# Patient Record
Sex: Female | Born: 1935
Health system: Southern US, Community
[De-identification: ages and names within clinical notes are randomized; demographics above are authoritative.]

## PROBLEM LIST (undated history)

## (undated) DIAGNOSIS — E0590 Thyrotoxicosis, unspecified without thyrotoxic crisis or storm: Secondary | ICD-10-CM

## (undated) DIAGNOSIS — J449 Chronic obstructive pulmonary disease, unspecified: Secondary | ICD-10-CM

## (undated) DIAGNOSIS — F419 Anxiety disorder, unspecified: Secondary | ICD-10-CM

## (undated) DIAGNOSIS — C50919 Malignant neoplasm of unspecified site of unspecified female breast: Secondary | ICD-10-CM

## (undated) DIAGNOSIS — J3800 Paralysis of vocal cords and larynx, unspecified: Secondary | ICD-10-CM

## (undated) DIAGNOSIS — H3530 Unspecified macular degeneration: Secondary | ICD-10-CM

## (undated) DIAGNOSIS — E042 Nontoxic multinodular goiter: Secondary | ICD-10-CM

## (undated) DIAGNOSIS — Z9109 Other allergy status, other than to drugs and biological substances: Secondary | ICD-10-CM

## (undated) DIAGNOSIS — J189 Pneumonia, unspecified organism: Secondary | ICD-10-CM

## (undated) DIAGNOSIS — K219 Gastro-esophageal reflux disease without esophagitis: Secondary | ICD-10-CM

## (undated) HISTORY — PX: MASTECTOMY: SHX3

## (undated) HISTORY — PX: ADENOIDECTOMY: SUR15

## (undated) HISTORY — PX: TONSILLECTOMY: SUR1361

## (undated) HISTORY — PX: HIP FRACTURE SURGERY: SHX118

---

## 2011-03-03 DIAGNOSIS — H43819 Vitreous degeneration, unspecified eye: Secondary | ICD-10-CM | POA: Diagnosis not present

## 2011-03-03 DIAGNOSIS — H35059 Retinal neovascularization, unspecified, unspecified eye: Secondary | ICD-10-CM | POA: Diagnosis not present

## 2011-03-03 DIAGNOSIS — H353290 Exudative age-related macular degeneration, unspecified eye, stage unspecified: Secondary | ICD-10-CM | POA: Diagnosis not present

## 2011-03-03 DIAGNOSIS — H353190 Nonexudative age-related macular degeneration, unspecified eye, stage unspecified: Secondary | ICD-10-CM | POA: Diagnosis not present

## 2011-05-12 DIAGNOSIS — H43819 Vitreous degeneration, unspecified eye: Secondary | ICD-10-CM | POA: Diagnosis not present

## 2011-05-12 DIAGNOSIS — H353290 Exudative age-related macular degeneration, unspecified eye, stage unspecified: Secondary | ICD-10-CM | POA: Diagnosis not present

## 2011-05-12 DIAGNOSIS — H35059 Retinal neovascularization, unspecified, unspecified eye: Secondary | ICD-10-CM | POA: Diagnosis not present

## 2011-05-12 DIAGNOSIS — H353190 Nonexudative age-related macular degeneration, unspecified eye, stage unspecified: Secondary | ICD-10-CM | POA: Diagnosis not present

## 2011-05-13 DIAGNOSIS — Z79899 Other long term (current) drug therapy: Secondary | ICD-10-CM | POA: Diagnosis not present

## 2011-05-13 DIAGNOSIS — E789 Disorder of lipoprotein metabolism, unspecified: Secondary | ICD-10-CM | POA: Diagnosis not present

## 2011-05-13 DIAGNOSIS — I10 Essential (primary) hypertension: Secondary | ICD-10-CM | POA: Diagnosis not present

## 2011-06-02 DIAGNOSIS — H905 Unspecified sensorineural hearing loss: Secondary | ICD-10-CM | POA: Diagnosis not present

## 2011-06-02 DIAGNOSIS — H6120 Impacted cerumen, unspecified ear: Secondary | ICD-10-CM | POA: Diagnosis not present

## 2011-06-02 DIAGNOSIS — H9120 Sudden idiopathic hearing loss, unspecified ear: Secondary | ICD-10-CM | POA: Diagnosis not present

## 2011-06-18 ENCOUNTER — Emergency Department (HOSPITAL_BASED_OUTPATIENT_CLINIC_OR_DEPARTMENT_OTHER)
Admission: EM | Admit: 2011-06-18 | Discharge: 2011-06-18 | Disposition: A | Discharge status: Hospice | Payer: Medicare Other | Attending: Emergency Medicine | Admitting: Emergency Medicine

## 2011-06-18 ENCOUNTER — Emergency Department (HOSPITAL_BASED_OUTPATIENT_CLINIC_OR_DEPARTMENT_OTHER): Payer: Medicare Other

## 2011-06-18 ENCOUNTER — Encounter (HOSPITAL_BASED_OUTPATIENT_CLINIC_OR_DEPARTMENT_OTHER): Payer: Self-pay

## 2011-06-18 DIAGNOSIS — Z853 Personal history of malignant neoplasm of breast: Secondary | ICD-10-CM | POA: Diagnosis not present

## 2011-06-18 DIAGNOSIS — J189 Pneumonia, unspecified organism: Secondary | ICD-10-CM

## 2011-06-18 DIAGNOSIS — R0902 Hypoxemia: Secondary | ICD-10-CM | POA: Diagnosis not present

## 2011-06-18 DIAGNOSIS — Z87891 Personal history of nicotine dependence: Secondary | ICD-10-CM | POA: Diagnosis not present

## 2011-06-18 DIAGNOSIS — R1011 Right upper quadrant pain: Secondary | ICD-10-CM | POA: Insufficient documentation

## 2011-06-18 DIAGNOSIS — I10 Essential (primary) hypertension: Secondary | ICD-10-CM | POA: Diagnosis not present

## 2011-06-18 DIAGNOSIS — R000 Tachycardia, unspecified: Secondary | ICD-10-CM | POA: Diagnosis not present

## 2011-06-18 DIAGNOSIS — Z79899 Other long term (current) drug therapy: Secondary | ICD-10-CM | POA: Diagnosis not present

## 2011-06-18 DIAGNOSIS — E785 Hyperlipidemia, unspecified: Secondary | ICD-10-CM | POA: Diagnosis not present

## 2011-06-18 DIAGNOSIS — J449 Chronic obstructive pulmonary disease, unspecified: Secondary | ICD-10-CM | POA: Diagnosis not present

## 2011-06-18 DIAGNOSIS — R0789 Other chest pain: Secondary | ICD-10-CM | POA: Diagnosis not present

## 2011-06-18 DIAGNOSIS — I498 Other specified cardiac arrhythmias: Secondary | ICD-10-CM | POA: Diagnosis not present

## 2011-06-18 DIAGNOSIS — R109 Unspecified abdominal pain: Secondary | ICD-10-CM | POA: Diagnosis not present

## 2011-06-18 DIAGNOSIS — M899 Disorder of bone, unspecified: Secondary | ICD-10-CM | POA: Diagnosis present

## 2011-06-18 HISTORY — DX: Pneumonia, unspecified organism: J18.9

## 2011-06-18 HISTORY — DX: Malignant neoplasm of unspecified site of unspecified female breast: C50.919

## 2011-06-18 HISTORY — DX: Unspecified macular degeneration: H35.30

## 2011-06-18 LAB — COMPREHENSIVE METABOLIC PANEL
ALT: 23 U/L (ref 0–35)
AST: 22 U/L (ref 0–37)
CO2: 31 mEq/L (ref 19–32)
Chloride: 97 mEq/L (ref 96–112)
Creatinine, Ser: 0.8 mg/dL (ref 0.50–1.10)
GFR calc Af Amer: 75 mL/min — ABNORMAL LOW (ref >90)
GFR calc non Af Amer: 64 mL/min — ABNORMAL LOW (ref >90)
Glucose, Bld: 124 mg/dL — ABNORMAL HIGH (ref 70–99)
Total Bilirubin: 0.5 mg/dL (ref 0.3–1.2)

## 2011-06-18 LAB — URINALYSIS, ROUTINE W REFLEX MICROSCOPIC
Glucose, UA: NEGATIVE mg/dL
Hgb urine dipstick: NEGATIVE
Leukocytes, UA: NEGATIVE
Specific Gravity, Urine: 1.014 (ref 1.005–1.030)
pH: 7 (ref 5.0–8.0)

## 2011-06-18 LAB — CBC
HCT: 44.6 % (ref 36.0–46.0)
Hemoglobin: 15.4 g/dL — ABNORMAL HIGH (ref 12.0–15.0)
RBC: 5.04 MIL/uL (ref 3.87–5.11)
RDW: 16.2 % — ABNORMAL HIGH (ref 11.5–15.5)
WBC: 23.6 10*3/uL — ABNORMAL HIGH (ref 4.0–10.5)

## 2011-06-18 LAB — DIFFERENTIAL
Basophils Absolute: 0 10*3/uL (ref 0.0–0.1)
Lymphocytes Relative: 8 % — ABNORMAL LOW (ref 12–46)
Lymphs Abs: 1.8 10*3/uL (ref 0.7–4.0)
Monocytes Absolute: 1.1 10*3/uL — ABNORMAL HIGH (ref 0.1–1.0)
Neutro Abs: 20.5 10*3/uL — ABNORMAL HIGH (ref 1.7–7.7)

## 2011-06-18 LAB — TROPONIN I: Troponin I: <0.3 ng/mL (ref <0.30)

## 2011-06-18 MED ORDER — DEXTROSE 5 % IV SOLN
1.0000 g | Freq: Once | INTRAVENOUS | Status: AC
  Administered 2011-06-18: 1 g via INTRAVENOUS
  Filled 2011-06-18: qty 10

## 2011-06-18 MED ORDER — SODIUM CHLORIDE 0.9 % IV BOLUS (SEPSIS)
500.0000 mL | Freq: Once | INTRAVENOUS | Status: AC
  Administered 2011-06-18: 16:00:00 via INTRAVENOUS

## 2011-06-18 NOTE — ED Notes (Signed)
Paged hospitalist direct--for pa --call back on [phone]

## 2011-06-18 NOTE — ED Notes (Signed)
MD at bedside. 

## 2011-06-18 NOTE — ED Notes (Signed)
Patient transported to X-ray via stretcher 

## 2011-06-18 NOTE — ED Provider Notes (Signed)
History     CSN: [id number]  Arrival date & time 06/18/11  1407   First MD Initiated Contact with Patient 06/18/11 1427      Chief Complaint  Patient presents with  . Abdominal Pain    (Consider location/radiation/quality/duration/timing/severity/associated sxs/prior treatment) HPI  Patient presents to emergency department complaining of gradual onset right upper quadrant pain, right chest pain and radiation of pain up into right lateral neck and into right upper back near her scapular region that began around 10 AM this morning. Patient states that last night she noticed that she had increased burping and woke up at 4 AM this morning to start baking. Patient states she ate breakfast at 8:30 without difficulty or complaint. At 10 AM patient states she had gradual onset pain as described, with pain persistent and unchanging for the next few hours. Patient states she took nothing for pain prior to arrival. Upon arrival to the emergency department, patient states pain has completely resolved. She denies aggravating or alleviating factors. Patient states she was recently finished a course of steroids that she was placed on by her ENT for potential sinus problems stating "they thought my sinuses could be affecting my hearing." She denies any recent antibiotic use. Patient states that she sees a cardiologist as her primary care provider every 6 months "because I family history of heart problems and not because I do." Patient states that she takes "just a few medicines a day because I'm really pretty healthy." Patient denies history of abdominal surgeries. Recent states that she has been worked up for potential gallbladder disease in the past because of similar symptoms that she's currently having however states "that whole gallbladder workup came up without showing anything." She denies any associated fevers, chills, recent illness, left-sided chest pain, shortness of breath, diaphoresis, nausea,  vomiting, diarrhea, dysuria, hematuria, or blood in her stool. Symptoms are gradual onset, and resolved currently.  Past Medical History  Diagnosis Date  . Breast cancer   . Pneumonia   . Macular degeneration     Past Surgical History  Procedure Date  . Mastectomy   . Tonsillectomy   . Adenoidectomy   . Hip fracture surgery     No family history on file.  History  Substance Use Topics  . Smoking status: Former Games developer  . Smokeless tobacco: Not on file  . Alcohol Use: No    OB History    Grav Para Term Preterm Abortions TAB SAB Ect Mult Living                  Review of Systems  All other systems reviewed and are negative.    Allergies  Other and Aminophylline  Home Medications   Current Outpatient Rx  Name Route Sig Dispense Refill  . CALCIUM-D PO Oral Take by mouth.    Marland Kitchen PEPCID PO Oral Take by mouth.    Marland Kitchen LISINOPRIL PO Oral Take by mouth.    . ONE-DAILY MULTI VITAMINS PO TABS Oral Take 1 tablet by mouth daily.    Marland Kitchen PRAVACHOL PO Oral Take by mouth.      BP 162/82  Pulse 123  Temp(Src) 97.9 F (36.6 C) (Oral)  Resp 24  SpO2 89%  Physical Exam  Nursing note and vitals reviewed. Constitutional: She is oriented to person, place, and time. She appears well-developed and well-nourished. No distress.  HENT:  Head: Normocephalic and atraumatic.  Eyes: Conjunctivae are normal.  Neck: Normal range of motion. Neck supple.  Cardiovascular:  Normal rate, regular rhythm, normal heart sounds and intact distal pulses.  Exam reveals no gallop and no friction rub.   No murmur heard. Pulmonary/Chest: Effort normal and breath sounds normal. No respiratory distress. She has no wheezes. She has no rales. She exhibits no tenderness.  Abdominal: Soft. Bowel sounds are normal. She exhibits no distension and no mass. There is no tenderness. There is no rebound and no guarding.  Musculoskeletal: Normal range of motion. She exhibits no edema and no tenderness.  Neurological:  She is alert and oriented to person, place, and time.  Skin: Skin is warm and dry. No rash noted. She is not diaphoretic. No erythema.  Psychiatric: She has a normal mood and affect.    ED Course  Procedures (including critical care time)  IV started. No complaints of pain. Will continue to monitor.    Date: 06/18/2011  Rate: 118  Rhythm: sinus tachycardia  QRS Axis: normal  Intervals: normal  ST/T Wave abnormalities: nonspecific T wave changes  Conduction Disutrbances:left anterior fascicular block  Narrative Interpretation: non provocative EKG  Old EKG Reviewed: none available   Labs Reviewed  CBC - Abnormal; Notable for the following:    WBC 23.6 (*)    Hemoglobin 15.4 (*)    RDW 16.2 (*)    All other components within normal limits  DIFFERENTIAL - Abnormal; Notable for the following:    Neutrophils Relative 87 (*)    Neutro Abs 20.5 (*)    Lymphocytes Relative 8 (*)    Monocytes Absolute 1.1 (*)    All other components within normal limits  COMPREHENSIVE METABOLIC PANEL - Abnormal; Notable for the following:    Glucose, Bld 124 (*)    GFR calc non Af Amer 64 (*)    GFR calc Af Amer 75 (*)    All other components within normal limits  GLUCOSE, CAPILLARY - Abnormal; Notable for the following:    Glucose-Capillary 132 (*)    All other components within normal limits  URINALYSIS, ROUTINE W REFLEX MICROSCOPIC  LIPASE, BLOOD  TROPONIN I  CULTURE, BLOOD (ROUTINE X 2)  CULTURE, BLOOD (ROUTINE X 2)   Dg Chest 2 View  06/18/2011  *RADIOLOGY REPORT*  Clinical Data: Burping, right neck, shoulder, flank, and upper quadrant pain, history pneumonia, breast cancer, mastectomy  CHEST - 2 VIEW  Comparison: None  Findings: Normal heart size, mediastinal contours, and pulmonary vascularity. Atherosclerotic calcification aorta. Emphysematous and bronchitic changes. Right middle lobe infiltrate consistent with pneumonia. Remaining lungs clear. No pleural effusion or pneumothorax. Bones  diffusely demineralized with biconvex thoracolumbar scoliosis.  IMPRESSION: Emphysematous and bronchitic changes consistent with COPD. Right middle lobe pneumonia.  Original Report Authenticated By: Lollie Marrow, M.D.   CRITICAL CARE Performed by: Drucie Opitz   Total critical care time: 27  Critical care time was exclusive of separately billable procedures and treating other patients.  Critical care was necessary to treat or prevent imminent or life-threatening deterioration.  Critical care was time spent personally by me on the following activities: development of treatment plan with patient and/or surrogate as well as nursing, discussions with consultants, evaluation of patient's response to treatment, examination of patient, obtaining history from patient or surrogate, ordering and performing treatments and interventions, ordering and review of laboratory studies, ordering and review of radiographic studies, pulse oximetry and re-evaluation of patient's condition.   1. CAP (community acquired pneumonia)   2. Hypoxia   3. Tachycardia       MDM  Given patient's  persistent tachycardia and hypoxia on room air and age, though pulse ox does correct nicely with 2 L of oxygen we would desire her to be transferred for admission for community-acquired pneumonia. Patient is agreeable to this plan but is requesting a transfer to [facility]. I spoke with the internal medicine physician, Dr. Karlyn Agee, who is accepting patient in transfer to [facility].        Drucie Opitz, [place] 06/18/11 [phone]

## 2011-06-18 NOTE — ED Notes (Signed)
CBG 132.  Ola Spurr RN notified.

## 2011-06-18 NOTE — ED Notes (Signed)
[facility] at bedside and preparing for transport.  Pt stable upon transfer.

## 2011-06-18 NOTE — ED Notes (Signed)
C/o excessive burping started 430am-worse after eating breakfast approx 830am-pain to right side of neck, RUQ pain, flank started approx 12pm-denies n/v/d-completed steroids yesterday for sinus

## 2011-06-18 NOTE — ED Provider Notes (Signed)
Medical screening examination/treatment/procedure(s) were performed by non-physician practitioner and as supervising physician I was immediately available for consultation/collaboration.   Tianna Baus A Bianca Raneri, MD 06/18/11 2336 

## 2011-06-18 NOTE — ED Notes (Signed)
Patient is being transferred to [facility]-- dr. Lillie Fragmin is receiving her to room 729.  [facility] is coming to pick her up.

## 2011-06-18 NOTE — ED Notes (Signed)
Pt returned from xr

## 2011-06-18 NOTE — ED Notes (Signed)
Report called to Aram Beecham, RN [facility]

## 2011-06-24 LAB — CULTURE, BLOOD (ROUTINE X 2): Culture: NO GROWTH

## 2011-06-30 DIAGNOSIS — E789 Disorder of lipoprotein metabolism, unspecified: Secondary | ICD-10-CM | POA: Diagnosis not present

## 2011-06-30 DIAGNOSIS — I10 Essential (primary) hypertension: Secondary | ICD-10-CM | POA: Diagnosis not present

## 2011-06-30 DIAGNOSIS — H9120 Sudden idiopathic hearing loss, unspecified ear: Secondary | ICD-10-CM | POA: Diagnosis not present

## 2011-07-21 DIAGNOSIS — H353190 Nonexudative age-related macular degeneration, unspecified eye, stage unspecified: Secondary | ICD-10-CM | POA: Diagnosis not present

## 2011-07-21 DIAGNOSIS — H43819 Vitreous degeneration, unspecified eye: Secondary | ICD-10-CM | POA: Diagnosis not present

## 2011-07-21 DIAGNOSIS — H35059 Retinal neovascularization, unspecified, unspecified eye: Secondary | ICD-10-CM | POA: Diagnosis not present

## 2011-07-21 DIAGNOSIS — H353290 Exudative age-related macular degeneration, unspecified eye, stage unspecified: Secondary | ICD-10-CM | POA: Diagnosis not present

## 2011-10-13 DIAGNOSIS — H35059 Retinal neovascularization, unspecified, unspecified eye: Secondary | ICD-10-CM | POA: Diagnosis not present

## 2011-10-13 DIAGNOSIS — H353190 Nonexudative age-related macular degeneration, unspecified eye, stage unspecified: Secondary | ICD-10-CM | POA: Diagnosis not present

## 2011-10-13 DIAGNOSIS — H353290 Exudative age-related macular degeneration, unspecified eye, stage unspecified: Secondary | ICD-10-CM | POA: Diagnosis not present

## 2011-10-13 DIAGNOSIS — H43819 Vitreous degeneration, unspecified eye: Secondary | ICD-10-CM | POA: Diagnosis not present

## 2011-10-16 DIAGNOSIS — Z23 Encounter for immunization: Secondary | ICD-10-CM | POA: Diagnosis not present

## 2011-11-25 DIAGNOSIS — I10 Essential (primary) hypertension: Secondary | ICD-10-CM | POA: Diagnosis not present

## 2011-11-25 DIAGNOSIS — E789 Disorder of lipoprotein metabolism, unspecified: Secondary | ICD-10-CM | POA: Diagnosis not present

## 2011-12-16 DIAGNOSIS — Z1231 Encounter for screening mammogram for malignant neoplasm of breast: Secondary | ICD-10-CM | POA: Diagnosis not present

## 2012-01-05 DIAGNOSIS — H353190 Nonexudative age-related macular degeneration, unspecified eye, stage unspecified: Secondary | ICD-10-CM | POA: Diagnosis not present

## 2012-01-05 DIAGNOSIS — H35059 Retinal neovascularization, unspecified, unspecified eye: Secondary | ICD-10-CM | POA: Diagnosis not present

## 2012-01-05 DIAGNOSIS — H43819 Vitreous degeneration, unspecified eye: Secondary | ICD-10-CM | POA: Diagnosis not present

## 2012-01-05 DIAGNOSIS — H353290 Exudative age-related macular degeneration, unspecified eye, stage unspecified: Secondary | ICD-10-CM | POA: Diagnosis not present

## 2012-01-20 DIAGNOSIS — E789 Disorder of lipoprotein metabolism, unspecified: Secondary | ICD-10-CM | POA: Diagnosis not present

## 2012-01-20 DIAGNOSIS — Z79899 Other long term (current) drug therapy: Secondary | ICD-10-CM | POA: Diagnosis not present

## 2012-02-02 ENCOUNTER — Encounter (HOSPITAL_BASED_OUTPATIENT_CLINIC_OR_DEPARTMENT_OTHER): Payer: Self-pay | Admitting: *Deleted

## 2012-02-02 ENCOUNTER — Emergency Department (HOSPITAL_BASED_OUTPATIENT_CLINIC_OR_DEPARTMENT_OTHER)
Admission: EM | Admit: 2012-02-02 | Discharge: 2012-02-02 | Disposition: A | Discharge status: Home / Self Care | Payer: Medicare Other | Attending: Emergency Medicine | Admitting: Emergency Medicine

## 2012-02-02 DIAGNOSIS — N390 Urinary tract infection, site not specified: Secondary | ICD-10-CM | POA: Diagnosis not present

## 2012-02-02 DIAGNOSIS — R350 Frequency of micturition: Secondary | ICD-10-CM | POA: Diagnosis not present

## 2012-02-02 DIAGNOSIS — Z853 Personal history of malignant neoplasm of breast: Secondary | ICD-10-CM | POA: Insufficient documentation

## 2012-02-02 DIAGNOSIS — Z7982 Long term (current) use of aspirin: Secondary | ICD-10-CM | POA: Diagnosis not present

## 2012-02-02 DIAGNOSIS — Z87891 Personal history of nicotine dependence: Secondary | ICD-10-CM | POA: Insufficient documentation

## 2012-02-02 DIAGNOSIS — Z79899 Other long term (current) drug therapy: Secondary | ICD-10-CM | POA: Insufficient documentation

## 2012-02-02 DIAGNOSIS — Z8701 Personal history of pneumonia (recurrent): Secondary | ICD-10-CM | POA: Insufficient documentation

## 2012-02-02 DIAGNOSIS — H3530 Unspecified macular degeneration: Secondary | ICD-10-CM | POA: Diagnosis not present

## 2012-02-02 DIAGNOSIS — R152 Fecal urgency: Secondary | ICD-10-CM | POA: Diagnosis not present

## 2012-02-02 LAB — URINALYSIS, ROUTINE W REFLEX MICROSCOPIC
Glucose, UA: NEGATIVE mg/dL
Specific Gravity, Urine: 1.004 — ABNORMAL LOW (ref 1.005–1.030)

## 2012-02-02 LAB — URINE MICROSCOPIC-ADD ON

## 2012-02-02 MED ORDER — SULFAMETHOXAZOLE-TMP DS 800-160 MG PO TABS
1.0000 | ORAL_TABLET | Freq: Once | ORAL | Status: AC
  Administered 2012-02-02: 1 via ORAL
  Filled 2012-02-02 (×2): qty 1

## 2012-02-02 MED ORDER — SULFAMETHOXAZOLE-TRIMETHOPRIM 800-160 MG PO TABS: 1.0000 | ORAL_TABLET | Freq: Two times a day (BID) | ORAL | Status: DC

## 2012-02-02 NOTE — ED Provider Notes (Signed)
History     CSN: [id number]  Arrival date & time 02/02/12  1424   First MD Initiated Contact with Patient 02/02/12 1539      Chief Complaint  Patient presents with  . Urinary Tract Infection    (Consider location/radiation/quality/duration/timing/severity/associated sxs/prior treatment) HPI Comments: Patient presents with a three-day history of burning on urination and urinary frequency. She has a history of UTIs in the past and says that Bactrim typically works for her UTIs. She denies any abdominal pain. She denies any back pain. She denies any nausea or vomiting. She denies any fevers. She states that the symptoms have been worsening since Sunday and they feel typical for her past UTIs.  Patient is a 78 y.o. female presenting with urinary tract infection.  Urinary Tract Infection Pertinent negatives include no chest pain, no abdominal pain, no headaches and no shortness of breath.    Past Medical History  Diagnosis Date  . Breast cancer   . Pneumonia   . Macular degeneration     Past Surgical History  Procedure Date  . Mastectomy   . Tonsillectomy   . Adenoidectomy   . Hip fracture surgery     History reviewed. No pertinent family history.  History  Substance Use Topics  . Smoking status: Former Games developer  . Smokeless tobacco: Not on file  . Alcohol Use: No    OB History    Grav Para Term Preterm Abortions TAB SAB Ect Mult Living                  Review of Systems  Constitutional: Negative for fever, chills, diaphoresis and fatigue.  HENT: Negative for congestion, rhinorrhea and sneezing.   Eyes: Negative.   Respiratory: Negative for cough, chest tightness and shortness of breath.   Cardiovascular: Negative for chest pain and leg swelling.  Gastrointestinal: Negative for nausea, vomiting, abdominal pain, diarrhea and blood in stool.  Genitourinary: Positive for urgency and frequency. Negative for hematuria, flank pain and difficulty urinating.    Musculoskeletal: Negative for back pain and arthralgias.  Skin: Negative for rash.  Neurological: Negative for dizziness, speech difficulty, weakness, numbness and headaches.    Allergies  Other and Aminophylline  Home Medications   Current Outpatient Rx  Name  Route  Sig  Dispense  Refill  . ASPIRIN 81 MG PO TABS   Oral   Take 81 mg by mouth daily.         Marland Kitchen CALCIUM-D PO   Oral   Take by mouth.         Marland Kitchen PEPCID PO   Oral   Take by mouth.         Marland Kitchen LISINOPRIL-HYDROCHLOROTHIAZIDE 10-12.5 MG PO TABS   Oral   Take 1 tablet by mouth daily.         Marland Kitchen ONE-DAILY MULTI VITAMINS PO TABS   Oral   Take 1 tablet by mouth daily.         Marland Kitchen PRAVASTATIN SODIUM 40 MG PO TABS   Oral   Take 40 mg by mouth daily.         . SULFAMETHOXAZOLE-TRIMETHOPRIM 800-160 MG PO TABS   Oral   Take 1 tablet by mouth every 12 (twelve) hours.   14 tablet   0     BP 150/89  Pulse 107  Temp 97.5 F (36.4 C) (Oral)  Resp 18  SpO2 94%  Physical Exam  Constitutional: She is oriented to person, place, and time. She appears well-developed  and well-nourished.  HENT:  Head: Normocephalic and atraumatic.  Eyes: Pupils are equal, round, and reactive to light.  Neck: Normal range of motion. Neck supple.  Cardiovascular: Normal rate, regular rhythm and normal heart sounds.   Pulmonary/Chest: Effort normal and breath sounds normal. No respiratory distress. She has no wheezes. She has no rales. She exhibits no tenderness.  Abdominal: Soft. Bowel sounds are normal. There is no tenderness. There is no rebound and no guarding.       No CVA tenderness  Musculoskeletal: Normal range of motion. She exhibits no edema.  Lymphadenopathy:    She has no cervical adenopathy.  Neurological: She is alert and oriented to person, place, and time.  Skin: Skin is warm and dry. No rash noted.  Psychiatric: She has a normal mood and affect.    ED Course  Procedures (including critical care  time)  Results for orders placed during the hospital encounter of 02/02/12  URINALYSIS, ROUTINE W REFLEX MICROSCOPIC      Component Value Range   Color, Urine YELLOW  YELLOW   APPearance CLEAR  CLEAR   Specific Gravity, Urine 1.004 (*) 1.005 - 1.030   pH 7.0  5.0 - 8.0   Glucose, UA NEGATIVE  NEGATIVE mg/dL   Hgb urine dipstick LARGE (*) NEGATIVE   Bilirubin Urine NEGATIVE  NEGATIVE   Ketones, ur NEGATIVE  NEGATIVE mg/dL   Protein, ur NEGATIVE  NEGATIVE mg/dL   Urobilinogen, UA 0.2  0.0 - 1.0 mg/dL   Nitrite NEGATIVE  NEGATIVE   Leukocytes, UA LARGE (*) NEGATIVE  URINE MICROSCOPIC-ADD ON      Component Value Range   Squamous Epithelial / LPF RARE  RARE   WBC, UA 21-50  <3 WBC/hpf   RBC / HPF 7-10  <3 RBC/hpf   Bacteria, UA FEW (*) RARE   No results found.    1. UTI (lower urinary tract infection)       MDM  Patient with symptoms typical for her UTIs. Her urine is consistent with UTI. She has no evidence of pyelonephritis. Her urine was sent for culture. She was started on Bactrim and advised to followup with her primary care physician or return here if her symptoms are not improving        Rolan Bucco, MD 02/02/12 1621

## 2012-02-02 NOTE — ED Notes (Signed)
Pt with UTI sx since Sunday pt reports burning and frequency denies fever or N/V

## 2012-02-04 LAB — URINE CULTURE

## 2012-04-05 DIAGNOSIS — H353290 Exudative age-related macular degeneration, unspecified eye, stage unspecified: Secondary | ICD-10-CM | POA: Diagnosis not present

## 2012-04-05 DIAGNOSIS — H353190 Nonexudative age-related macular degeneration, unspecified eye, stage unspecified: Secondary | ICD-10-CM | POA: Diagnosis not present

## 2012-04-05 DIAGNOSIS — H35059 Retinal neovascularization, unspecified, unspecified eye: Secondary | ICD-10-CM | POA: Diagnosis not present

## 2012-05-16 DIAGNOSIS — E789 Disorder of lipoprotein metabolism, unspecified: Secondary | ICD-10-CM | POA: Diagnosis not present

## 2012-05-16 DIAGNOSIS — I10 Essential (primary) hypertension: Secondary | ICD-10-CM | POA: Diagnosis not present

## 2012-05-16 DIAGNOSIS — Z79899 Other long term (current) drug therapy: Secondary | ICD-10-CM | POA: Diagnosis not present

## 2012-06-14 DIAGNOSIS — H353290 Exudative age-related macular degeneration, unspecified eye, stage unspecified: Secondary | ICD-10-CM | POA: Diagnosis not present

## 2012-06-14 DIAGNOSIS — H35379 Puckering of macula, unspecified eye: Secondary | ICD-10-CM | POA: Diagnosis not present

## 2012-06-14 DIAGNOSIS — H353190 Nonexudative age-related macular degeneration, unspecified eye, stage unspecified: Secondary | ICD-10-CM | POA: Diagnosis not present

## 2012-07-03 DIAGNOSIS — J3800 Paralysis of vocal cords and larynx, unspecified: Secondary | ICD-10-CM

## 2012-07-03 DIAGNOSIS — E042 Nontoxic multinodular goiter: Secondary | ICD-10-CM

## 2012-07-03 HISTORY — DX: Nontoxic multinodular goiter: E04.2

## 2012-07-03 HISTORY — DX: Paralysis of vocal cords and larynx, unspecified: J38.00

## 2012-08-17 DIAGNOSIS — J019 Acute sinusitis, unspecified: Secondary | ICD-10-CM | POA: Diagnosis not present

## 2012-08-23 DIAGNOSIS — H353290 Exudative age-related macular degeneration, unspecified eye, stage unspecified: Secondary | ICD-10-CM | POA: Diagnosis not present

## 2012-08-23 DIAGNOSIS — H35429 Microcystoid degeneration of retina, unspecified eye: Secondary | ICD-10-CM | POA: Diagnosis not present

## 2012-08-23 DIAGNOSIS — H35059 Retinal neovascularization, unspecified, unspecified eye: Secondary | ICD-10-CM | POA: Diagnosis not present

## 2012-08-23 DIAGNOSIS — H353190 Nonexudative age-related macular degeneration, unspecified eye, stage unspecified: Secondary | ICD-10-CM | POA: Diagnosis not present

## 2012-08-31 DIAGNOSIS — J3801 Paralysis of vocal cords and larynx, unilateral: Secondary | ICD-10-CM | POA: Diagnosis not present

## 2012-09-01 DIAGNOSIS — J3801 Paralysis of vocal cords and larynx, unilateral: Secondary | ICD-10-CM | POA: Diagnosis not present

## 2012-09-02 DIAGNOSIS — J3801 Paralysis of vocal cords and larynx, unilateral: Secondary | ICD-10-CM | POA: Diagnosis not present

## 2012-09-02 DIAGNOSIS — J438 Other emphysema: Secondary | ICD-10-CM | POA: Diagnosis not present

## 2012-09-02 DIAGNOSIS — R918 Other nonspecific abnormal finding of lung field: Secondary | ICD-10-CM | POA: Diagnosis not present

## 2012-09-05 DIAGNOSIS — E279 Disorder of adrenal gland, unspecified: Secondary | ICD-10-CM | POA: Diagnosis not present

## 2012-09-05 DIAGNOSIS — E041 Nontoxic single thyroid nodule: Secondary | ICD-10-CM | POA: Diagnosis not present

## 2012-09-06 DIAGNOSIS — E279 Disorder of adrenal gland, unspecified: Secondary | ICD-10-CM | POA: Diagnosis not present

## 2012-09-07 DIAGNOSIS — E041 Nontoxic single thyroid nodule: Secondary | ICD-10-CM | POA: Diagnosis not present

## 2012-10-20 ENCOUNTER — Ambulatory Visit: Payer: PRIVATE HEALTH INSURANCE | Admitting: Family Medicine

## 2012-10-20 DIAGNOSIS — Z23 Encounter for immunization: Secondary | ICD-10-CM | POA: Diagnosis not present

## 2012-10-28 ENCOUNTER — Ambulatory Visit: Payer: PRIVATE HEALTH INSURANCE | Admitting: Family Medicine

## 2012-11-02 DIAGNOSIS — E079 Disorder of thyroid, unspecified: Secondary | ICD-10-CM | POA: Diagnosis not present

## 2012-11-02 DIAGNOSIS — E7800 Pure hypercholesterolemia, unspecified: Secondary | ICD-10-CM | POA: Diagnosis not present

## 2012-11-02 DIAGNOSIS — K219 Gastro-esophageal reflux disease without esophagitis: Secondary | ICD-10-CM | POA: Diagnosis not present

## 2012-11-02 DIAGNOSIS — R490 Dysphonia: Secondary | ICD-10-CM | POA: Diagnosis not present

## 2012-11-04 DIAGNOSIS — E079 Disorder of thyroid, unspecified: Secondary | ICD-10-CM | POA: Diagnosis not present

## 2012-11-10 DIAGNOSIS — E079 Disorder of thyroid, unspecified: Secondary | ICD-10-CM | POA: Diagnosis not present

## 2012-11-10 DIAGNOSIS — C50919 Malignant neoplasm of unspecified site of unspecified female breast: Secondary | ICD-10-CM | POA: Diagnosis not present

## 2012-11-16 DIAGNOSIS — H35059 Retinal neovascularization, unspecified, unspecified eye: Secondary | ICD-10-CM | POA: Diagnosis not present

## 2012-11-16 DIAGNOSIS — H353290 Exudative age-related macular degeneration, unspecified eye, stage unspecified: Secondary | ICD-10-CM | POA: Diagnosis not present

## 2012-11-17 DIAGNOSIS — N63 Unspecified lump in unspecified breast: Secondary | ICD-10-CM | POA: Diagnosis not present

## 2012-11-17 DIAGNOSIS — C50919 Malignant neoplasm of unspecified site of unspecified female breast: Secondary | ICD-10-CM | POA: Diagnosis not present

## 2012-11-17 DIAGNOSIS — E042 Nontoxic multinodular goiter: Secondary | ICD-10-CM | POA: Diagnosis not present

## 2013-01-13 DIAGNOSIS — H353190 Nonexudative age-related macular degeneration, unspecified eye, stage unspecified: Secondary | ICD-10-CM | POA: Diagnosis not present

## 2013-01-13 DIAGNOSIS — H43819 Vitreous degeneration, unspecified eye: Secondary | ICD-10-CM | POA: Diagnosis not present

## 2013-01-13 DIAGNOSIS — H35059 Retinal neovascularization, unspecified, unspecified eye: Secondary | ICD-10-CM | POA: Diagnosis not present

## 2013-01-13 DIAGNOSIS — H353290 Exudative age-related macular degeneration, unspecified eye, stage unspecified: Secondary | ICD-10-CM | POA: Diagnosis not present

## 2013-01-29 DIAGNOSIS — N390 Urinary tract infection, site not specified: Secondary | ICD-10-CM | POA: Diagnosis not present

## 2013-02-06 ENCOUNTER — Other Ambulatory Visit: Payer: Self-pay

## 2013-02-06 ENCOUNTER — Encounter (HOSPITAL_BASED_OUTPATIENT_CLINIC_OR_DEPARTMENT_OTHER): Payer: Self-pay | Admitting: Emergency Medicine

## 2013-02-06 ENCOUNTER — Emergency Department (HOSPITAL_BASED_OUTPATIENT_CLINIC_OR_DEPARTMENT_OTHER)
Admission: EM | Admit: 2013-02-06 | Discharge: 2013-02-06 | Disposition: A | Discharge status: Home / Self Care | Payer: Medicare Other | Attending: Emergency Medicine | Admitting: Emergency Medicine

## 2013-02-06 ENCOUNTER — Emergency Department (HOSPITAL_BASED_OUTPATIENT_CLINIC_OR_DEPARTMENT_OTHER): Payer: Medicare Other

## 2013-02-06 DIAGNOSIS — K219 Gastro-esophageal reflux disease without esophagitis: Secondary | ICD-10-CM | POA: Insufficient documentation

## 2013-02-06 DIAGNOSIS — Z87891 Personal history of nicotine dependence: Secondary | ICD-10-CM | POA: Insufficient documentation

## 2013-02-06 DIAGNOSIS — Z8701 Personal history of pneumonia (recurrent): Secondary | ICD-10-CM | POA: Diagnosis not present

## 2013-02-06 DIAGNOSIS — R079 Chest pain, unspecified: Secondary | ICD-10-CM | POA: Diagnosis not present

## 2013-02-06 DIAGNOSIS — Z8669 Personal history of other diseases of the nervous system and sense organs: Secondary | ICD-10-CM | POA: Diagnosis not present

## 2013-02-06 DIAGNOSIS — J982 Interstitial emphysema: Secondary | ICD-10-CM | POA: Diagnosis not present

## 2013-02-06 DIAGNOSIS — Z853 Personal history of malignant neoplasm of breast: Secondary | ICD-10-CM | POA: Insufficient documentation

## 2013-02-06 DIAGNOSIS — Z792 Long term (current) use of antibiotics: Secondary | ICD-10-CM | POA: Insufficient documentation

## 2013-02-06 DIAGNOSIS — I10 Essential (primary) hypertension: Secondary | ICD-10-CM | POA: Diagnosis not present

## 2013-02-06 DIAGNOSIS — Z862 Personal history of diseases of the blood and blood-forming organs and certain disorders involving the immune mechanism: Secondary | ICD-10-CM | POA: Diagnosis not present

## 2013-02-06 DIAGNOSIS — Z9010 Acquired absence of unspecified breast and nipple: Secondary | ICD-10-CM | POA: Diagnosis not present

## 2013-02-06 DIAGNOSIS — R55 Syncope and collapse: Secondary | ICD-10-CM | POA: Insufficient documentation

## 2013-02-06 DIAGNOSIS — E0590 Thyrotoxicosis, unspecified without thyrotoxic crisis or storm: Secondary | ICD-10-CM | POA: Diagnosis not present

## 2013-02-06 DIAGNOSIS — Z8639 Personal history of other endocrine, nutritional and metabolic disease: Secondary | ICD-10-CM | POA: Insufficient documentation

## 2013-02-06 DIAGNOSIS — Z9889 Other specified postprocedural states: Secondary | ICD-10-CM | POA: Diagnosis not present

## 2013-02-06 DIAGNOSIS — Z79899 Other long term (current) drug therapy: Secondary | ICD-10-CM | POA: Insufficient documentation

## 2013-02-06 DIAGNOSIS — J441 Chronic obstructive pulmonary disease with (acute) exacerbation: Secondary | ICD-10-CM | POA: Diagnosis not present

## 2013-02-06 DIAGNOSIS — F411 Generalized anxiety disorder: Secondary | ICD-10-CM | POA: Insufficient documentation

## 2013-02-06 DIAGNOSIS — Z7982 Long term (current) use of aspirin: Secondary | ICD-10-CM | POA: Diagnosis not present

## 2013-02-06 DIAGNOSIS — J438 Other emphysema: Secondary | ICD-10-CM | POA: Diagnosis not present

## 2013-02-06 DIAGNOSIS — R0789 Other chest pain: Secondary | ICD-10-CM | POA: Diagnosis not present

## 2013-02-06 DIAGNOSIS — I498 Other specified cardiac arrhythmias: Secondary | ICD-10-CM | POA: Diagnosis not present

## 2013-02-06 HISTORY — DX: Gastro-esophageal reflux disease without esophagitis: K21.9

## 2013-02-06 HISTORY — DX: Other allergy status, other than to drugs and biological substances: Z91.09

## 2013-02-06 HISTORY — DX: Chronic obstructive pulmonary disease, unspecified: J44.9

## 2013-02-06 HISTORY — DX: Anxiety disorder, unspecified: F41.9

## 2013-02-06 HISTORY — DX: Nontoxic multinodular goiter: E04.2

## 2013-02-06 HISTORY — DX: Paralysis of vocal cords and larynx, unspecified: J38.00

## 2013-02-06 HISTORY — DX: Thyrotoxicosis, unspecified without thyrotoxic crisis or storm: E05.90

## 2013-02-06 LAB — CBC WITH DIFFERENTIAL/PLATELET
BASOS ABS: 0 10*3/uL (ref 0.0–0.1)
BASOS PCT: 0 % (ref 0–1)
EOS ABS: 0.1 10*3/uL (ref 0.0–0.7)
EOS PCT: 1 % (ref 0–5)
HCT: 42 % (ref 36.0–46.0)
Hemoglobin: 14.4 g/dL (ref 12.0–15.0)
LYMPHS ABS: 2.3 10*3/uL (ref 0.7–4.0)
Lymphocytes Relative: 18 % (ref 12–46)
MCH: 30.3 pg (ref 26.0–34.0)
MCHC: 34.3 g/dL (ref 30.0–36.0)
MCV: 88.4 fL (ref 78.0–100.0)
Monocytes Absolute: 0.8 10*3/uL (ref 0.1–1.0)
Monocytes Relative: 6 % (ref 3–12)
NEUTROS PCT: 76 % (ref 43–77)
Neutro Abs: 9.9 10*3/uL — ABNORMAL HIGH (ref 1.7–7.7)
PLATELETS: 295 10*3/uL (ref 150–400)
RBC: 4.75 MIL/uL (ref 3.87–5.11)
RDW: 15.8 % — AB (ref 11.5–15.5)
WBC: 13.1 10*3/uL — ABNORMAL HIGH (ref 4.0–10.5)

## 2013-02-06 LAB — COMPREHENSIVE METABOLIC PANEL
ALBUMIN: 3.7 g/dL (ref 3.5–5.2)
ALT: 15 U/L (ref 0–35)
AST: 20 U/L (ref 0–37)
Alkaline Phosphatase: 101 U/L (ref 39–117)
BUN: 17 mg/dL (ref 6–23)
CALCIUM: 9.3 mg/dL (ref 8.4–10.5)
CO2: 23 mEq/L (ref 19–32)
CREATININE: 1 mg/dL (ref 0.50–1.10)
Chloride: 97 mEq/L (ref 96–112)
GFR calc Af Amer: 56 mL/min — ABNORMAL LOW (ref >90)
GFR, EST NON AFRICAN AMERICAN: 48 mL/min — AB (ref >90)
Glucose, Bld: 144 mg/dL — ABNORMAL HIGH (ref 70–99)
Potassium: 4.3 mEq/L (ref 3.7–5.3)
SODIUM: 135 meq/L — AB (ref 137–147)
TOTAL PROTEIN: 6.5 g/dL (ref 6.0–8.3)
Total Bilirubin: 0.2 mg/dL — ABNORMAL LOW (ref 0.3–1.2)

## 2013-02-06 LAB — TROPONIN I

## 2013-02-06 MED ORDER — ASPIRIN 81 MG PO CHEW
324.0000 mg | CHEWABLE_TABLET | Freq: Once | ORAL | Status: AC
Start: 2013-02-06 — End: 2013-02-06
  Administered 2013-02-06: 324 mg via ORAL
  Filled 2013-02-06: qty 4

## 2013-02-06 NOTE — ED Notes (Addendum)
Patient states she woke up this morning feeling anxious this morning like she was not going to "survive the day", states this feeling was associated with weakness and nausea.  Which she feels this is associated with not eating.    States she has a several day history of burping which causes left chest pain.  States she broke her dentures three days ago and has been unable to eat.  Since June 2014, has had multiple test for hoarseness, including an endoscopy and chest CT which showed paralyzed vocal cord and plaque around her heart and aorta.

## 2013-02-06 NOTE — ED Provider Notes (Signed)
CSN: [id number]     Arrival date & time 02/06/13  7616 History   First MD Initiated Contact with Patient 02/06/13 [phone]     Chief Complaint  Patient presents with  . Chest Pain    burping    Patient is a 80 y.o. female presenting with chest pain. The history is provided by the patient and a relative.  Chest Pain Pain location:  L chest Pain quality: pressure   Pain radiates to:  Does not radiate Pain severity:  Mild Onset quality:  Gradual Duration: a minute. Timing:  Intermittent Progression:  Resolved Relieved by:  Nothing Exacerbated by: belching. Associated symptoms: near-syncope and shortness of breath   Associated symptoms: no fever and not vomiting   pt presents for multiple complaints She reports she woke up this morning and felt weak and nearly passed out, no LOC reported She reports brief episode of chest pain that has since resolved She reports some belching associated with CP She reports chronic SOB due to COPD She reports feeling anxious She reports that her dentures broke 3 days ago and she has decreased PO and that is why she feels weak   Past Medical History  Diagnosis Date  . Breast cancer   . Pneumonia   . Macular degeneration   . Vocal cord paralysis June 2014  . Acid reflux   . Environmental allergies   . COPD (chronic obstructive pulmonary disease)   . Anxiety   . Multiple thyroid nodules June 2014  . Hyperthyroidism    Past Surgical History  Procedure Laterality Date  . Mastectomy    . Tonsillectomy    . Adenoidectomy    . Hip fracture surgery     No family history on file. History  Substance Use Topics  . Smoking status: Former Smoker    Quit date: 02/03/1988  . Smokeless tobacco: Not on file  . Alcohol Use: No   OB History   Grav Para Term Preterm Abortions TAB SAB Ect Mult Living                 Review of Systems  Constitutional: Negative for fever.  Respiratory: Positive for shortness of breath.   Cardiovascular: Positive for  chest pain and near-syncope.  Gastrointestinal: Negative for vomiting and blood in stool.  Neurological: Negative for syncope.  All other systems reviewed and are negative.    Allergies  Other and Aminophylline  Home Medications   Current Outpatient Rx  Name  Route  Sig  Dispense  Refill  . aspirin 81 MG tablet   Oral   Take 81 mg by mouth daily.         . Calcium Carbonate-Vitamin D (CALCIUM-D PO)   Oral   Take by mouth.         . Famotidine (PEPCID PO)   Oral   Take by mouth.         Marland Kitchen lisinopril-hydrochlorothiazide (PRINZIDE,ZESTORETIC) 10-12.5 MG per tablet   Oral   Take 1 tablet by mouth daily.         . Multiple Vitamin (MULTIVITAMIN) tablet   Oral   Take 1 tablet by mouth daily.         . pravastatin (PRAVACHOL) 40 MG tablet   Oral   Take 40 mg by mouth daily.         Marland Kitchen sulfamethoxazole-trimethoprim (SEPTRA DS) 800-160 MG per tablet   Oral   Take 1 tablet by mouth every 12 (twelve) hours.   Friant  tablet   0    BP 168/78  Pulse 107  Temp(Src) 98.6 F (37 C) (Oral)  Resp 20  SpO2 95% Physical Exam CONSTITUTIONAL: Well developed/well nourished, no distress HEAD: Normocephalic/atraumatic EYES: EOMI/PERRL ENMT: Mucous membranes moist NECK: supple no meningeal signs SPINE:entire spine nontender CV: S1/S2 noted, no murmurs/rubs/gallops noted LUNGS: Lungs are clear to auscultation bilaterally, no apparent distress ABDOMEN: soft, nontender, no rebound or guarding NEURO: Pt is awake/alert, moves all extremitiesx4 EXTREMITIES: pulses normal, full ROM SKIN: warm, color normal PSYCH: no abnormalities of mood noted  ED Course  Procedures (including critical care time) Labs Review Labs Reviewed  CBC WITH DIFFERENTIAL - Abnormal; Notable for the following:    WBC 13.1 (*)    RDW 15.8 (*)    Neutro Abs 9.9 (*)    All other components within normal limits  TROPONIN I  COMPREHENSIVE METABOLIC PANEL   Imaging Review Dg Chest 1  View  02/06/2013   CLINICAL DATA:  Chest pain.  Personal history of breast cancer.  EXAM: CHEST - 1 VIEW  COMPARISON:  None.  FINDINGS: Emphysema is present. Cardiopericardial silhouette and mediastinal contours are within normal limits. Bilateral pleural apical scarring is present. Aortic arch atherosclerosis. No airspace disease. No effusion. No pneumothorax. Bilateral glenohumeral osteoarthritis.  The pulmonary nodules identified on prior CT are not resolved radiographically.  IMPRESSION: Emphysema without acute cardiopulmonary disease.   Electronically Signed   By: Dereck Ligas M.D.   On: 02/06/2013 08:24    EKG Interpretation    Date/Time:  Monday February 06 2013 07:35:21 EST Ventricular Rate:  101 PR Interval:  132 QRS Duration: 74 QT Interval:  336 QTC Calculation: 435 R Axis:   -59 Text Interpretation:  Sinus tachycardia Biatrial enlargement Left axis deviation Anteroseptal infarct , age undetermined Abnormal ECG No significant change since last tracing Confirmed by Christy Gentles  MD, [place] [phone]) on 02/06/2013 7:43:16 AM            Date: 02/06/2013 912am  Rate: 91  Rhythm: normal sinus rhythm  QRS Axis: left  Intervals: normal  ST/T Wave abnormalities: nonspecific ST changes  Conduction Disutrbances:none  Narrative Interpretation:   Old EKG Reviewed: unchanged   MDM  No diagnosis found. Nursing notes including past medical history and social history reviewed and considered in documentation xrays reviewed and considered Labs/vital reviewed and considered   9:45 AM Pt did have recurrent episode CP while in the ED Repeat EKG unchanged She was given ASA Her CP is now resolved Will admit for further workup due to CP and near syncope D/w dr Daphene Jaeger at [facility] (pt requests [facility])   Sharyon Cable, MD 02/06/13 [phone]

## 2013-02-07 DIAGNOSIS — I498 Other specified cardiac arrhythmias: Secondary | ICD-10-CM | POA: Diagnosis not present

## 2013-02-07 DIAGNOSIS — J982 Interstitial emphysema: Secondary | ICD-10-CM | POA: Diagnosis not present

## 2013-02-07 DIAGNOSIS — J438 Other emphysema: Secondary | ICD-10-CM | POA: Diagnosis not present

## 2013-02-07 DIAGNOSIS — Z853 Personal history of malignant neoplasm of breast: Secondary | ICD-10-CM | POA: Diagnosis not present

## 2013-02-07 DIAGNOSIS — R079 Chest pain, unspecified: Secondary | ICD-10-CM | POA: Diagnosis not present

## 2013-02-07 DIAGNOSIS — Z79899 Other long term (current) drug therapy: Secondary | ICD-10-CM | POA: Diagnosis not present

## 2013-02-07 DIAGNOSIS — K828 Other specified diseases of gallbladder: Secondary | ICD-10-CM | POA: Diagnosis not present

## 2013-02-07 DIAGNOSIS — I10 Essential (primary) hypertension: Secondary | ICD-10-CM | POA: Diagnosis not present

## 2013-02-07 DIAGNOSIS — R0789 Other chest pain: Secondary | ICD-10-CM | POA: Diagnosis not present

## 2013-02-07 DIAGNOSIS — K219 Gastro-esophageal reflux disease without esophagitis: Secondary | ICD-10-CM | POA: Diagnosis not present

## 2013-02-14 DIAGNOSIS — I10 Essential (primary) hypertension: Secondary | ICD-10-CM | POA: Diagnosis not present

## 2013-02-14 DIAGNOSIS — E079 Disorder of thyroid, unspecified: Secondary | ICD-10-CM | POA: Diagnosis not present

## 2013-02-14 DIAGNOSIS — J438 Other emphysema: Secondary | ICD-10-CM | POA: Diagnosis not present

## 2013-02-14 DIAGNOSIS — K219 Gastro-esophageal reflux disease without esophagitis: Secondary | ICD-10-CM | POA: Diagnosis not present

## 2013-02-21 DIAGNOSIS — E0590 Thyrotoxicosis, unspecified without thyrotoxic crisis or storm: Secondary | ICD-10-CM | POA: Diagnosis not present

## 2013-02-21 DIAGNOSIS — E042 Nontoxic multinodular goiter: Secondary | ICD-10-CM | POA: Diagnosis not present

## 2013-02-21 DIAGNOSIS — E279 Disorder of adrenal gland, unspecified: Secondary | ICD-10-CM | POA: Diagnosis not present

## 2013-02-21 DIAGNOSIS — E041 Nontoxic single thyroid nodule: Secondary | ICD-10-CM | POA: Diagnosis not present

## 2013-03-10 DIAGNOSIS — H353290 Exudative age-related macular degeneration, unspecified eye, stage unspecified: Secondary | ICD-10-CM | POA: Diagnosis not present

## 2013-03-10 DIAGNOSIS — H35059 Retinal neovascularization, unspecified, unspecified eye: Secondary | ICD-10-CM | POA: Diagnosis not present

## 2013-03-16 DIAGNOSIS — Z7982 Long term (current) use of aspirin: Secondary | ICD-10-CM | POA: Diagnosis not present

## 2013-03-16 DIAGNOSIS — Z888 Allergy status to other drugs, medicaments and biological substances status: Secondary | ICD-10-CM | POA: Diagnosis not present

## 2013-03-16 DIAGNOSIS — Z853 Personal history of malignant neoplasm of breast: Secondary | ICD-10-CM | POA: Diagnosis not present

## 2013-03-16 DIAGNOSIS — Z87891 Personal history of nicotine dependence: Secondary | ICD-10-CM | POA: Diagnosis not present

## 2013-03-16 DIAGNOSIS — E042 Nontoxic multinodular goiter: Secondary | ICD-10-CM | POA: Diagnosis not present

## 2013-03-16 DIAGNOSIS — Z79899 Other long term (current) drug therapy: Secondary | ICD-10-CM | POA: Diagnosis not present

## 2013-03-16 DIAGNOSIS — E041 Nontoxic single thyroid nodule: Secondary | ICD-10-CM | POA: Diagnosis not present

## 2013-03-16 DIAGNOSIS — Z9010 Acquired absence of unspecified breast and nipple: Secondary | ICD-10-CM | POA: Diagnosis not present

## 2013-03-16 DIAGNOSIS — K219 Gastro-esophageal reflux disease without esophagitis: Secondary | ICD-10-CM | POA: Diagnosis not present

## 2013-05-10 DIAGNOSIS — E079 Disorder of thyroid, unspecified: Secondary | ICD-10-CM | POA: Diagnosis not present

## 2013-05-10 DIAGNOSIS — I10 Essential (primary) hypertension: Secondary | ICD-10-CM | POA: Diagnosis not present

## 2013-05-19 DIAGNOSIS — H353290 Exudative age-related macular degeneration, unspecified eye, stage unspecified: Secondary | ICD-10-CM | POA: Diagnosis not present

## 2013-05-19 DIAGNOSIS — H353190 Nonexudative age-related macular degeneration, unspecified eye, stage unspecified: Secondary | ICD-10-CM | POA: Diagnosis not present

## 2013-05-19 DIAGNOSIS — H35059 Retinal neovascularization, unspecified, unspecified eye: Secondary | ICD-10-CM | POA: Diagnosis not present

## 2013-08-18 DIAGNOSIS — H353190 Nonexudative age-related macular degeneration, unspecified eye, stage unspecified: Secondary | ICD-10-CM | POA: Diagnosis not present

## 2013-08-18 DIAGNOSIS — H353290 Exudative age-related macular degeneration, unspecified eye, stage unspecified: Secondary | ICD-10-CM | POA: Diagnosis not present

## 2013-08-18 DIAGNOSIS — H35059 Retinal neovascularization, unspecified, unspecified eye: Secondary | ICD-10-CM | POA: Diagnosis not present

## 2013-09-11 DIAGNOSIS — H6690 Otitis media, unspecified, unspecified ear: Secondary | ICD-10-CM | POA: Diagnosis not present

## 2013-10-05 DIAGNOSIS — Z23 Encounter for immunization: Secondary | ICD-10-CM | POA: Diagnosis not present

## 2013-11-22 DIAGNOSIS — E782 Mixed hyperlipidemia: Secondary | ICD-10-CM | POA: Diagnosis not present

## 2013-11-22 DIAGNOSIS — Z1389 Encounter for screening for other disorder: Secondary | ICD-10-CM | POA: Diagnosis not present

## 2013-11-22 DIAGNOSIS — R300 Dysuria: Secondary | ICD-10-CM | POA: Diagnosis not present

## 2013-11-22 DIAGNOSIS — J383 Other diseases of vocal cords: Secondary | ICD-10-CM | POA: Diagnosis not present

## 2013-11-22 DIAGNOSIS — J309 Allergic rhinitis, unspecified: Secondary | ICD-10-CM | POA: Diagnosis not present

## 2013-11-22 DIAGNOSIS — Z23 Encounter for immunization: Secondary | ICD-10-CM | POA: Diagnosis not present

## 2013-11-22 DIAGNOSIS — J449 Chronic obstructive pulmonary disease, unspecified: Secondary | ICD-10-CM | POA: Diagnosis not present

## 2013-11-22 DIAGNOSIS — E041 Nontoxic single thyroid nodule: Secondary | ICD-10-CM | POA: Diagnosis not present

## 2013-11-22 DIAGNOSIS — I10 Essential (primary) hypertension: Secondary | ICD-10-CM | POA: Diagnosis not present

## 2013-11-22 DIAGNOSIS — H3530 Unspecified macular degeneration: Secondary | ICD-10-CM | POA: Diagnosis not present

## 2013-11-27 DIAGNOSIS — E0590 Thyrotoxicosis, unspecified without thyrotoxic crisis or storm: Secondary | ICD-10-CM | POA: Diagnosis not present

## 2013-11-27 DIAGNOSIS — E279 Disorder of adrenal gland, unspecified: Secondary | ICD-10-CM | POA: Diagnosis not present

## 2013-11-27 DIAGNOSIS — E042 Nontoxic multinodular goiter: Secondary | ICD-10-CM | POA: Diagnosis not present

## 2013-11-28 DIAGNOSIS — E0590 Thyrotoxicosis, unspecified without thyrotoxic crisis or storm: Secondary | ICD-10-CM | POA: Diagnosis not present

## 2013-11-28 DIAGNOSIS — H3531 Nonexudative age-related macular degeneration: Secondary | ICD-10-CM | POA: Diagnosis not present

## 2013-11-28 DIAGNOSIS — H3532 Exudative age-related macular degeneration: Secondary | ICD-10-CM | POA: Diagnosis not present

## 2013-11-28 DIAGNOSIS — E279 Disorder of adrenal gland, unspecified: Secondary | ICD-10-CM | POA: Diagnosis not present

## 2013-12-05 ENCOUNTER — Emergency Department (HOSPITAL_BASED_OUTPATIENT_CLINIC_OR_DEPARTMENT_OTHER)
Admission: EM | Admit: 2013-12-05 | Discharge: 2013-12-06 | Disposition: A | Discharge status: Other Acute Inpatient Hospital | Payer: Medicare Other | Attending: Emergency Medicine | Admitting: Emergency Medicine

## 2013-12-05 ENCOUNTER — Emergency Department (HOSPITAL_BASED_OUTPATIENT_CLINIC_OR_DEPARTMENT_OTHER): Payer: Medicare Other

## 2013-12-05 ENCOUNTER — Encounter (HOSPITAL_BASED_OUTPATIENT_CLINIC_OR_DEPARTMENT_OTHER): Payer: Self-pay | Admitting: *Deleted

## 2013-12-05 DIAGNOSIS — I160 Hypertensive urgency: Secondary | ICD-10-CM

## 2013-12-05 DIAGNOSIS — K219 Gastro-esophageal reflux disease without esophagitis: Secondary | ICD-10-CM | POA: Diagnosis not present

## 2013-12-05 DIAGNOSIS — J449 Chronic obstructive pulmonary disease, unspecified: Secondary | ICD-10-CM | POA: Insufficient documentation

## 2013-12-05 DIAGNOSIS — Z8669 Personal history of other diseases of the nervous system and sense organs: Secondary | ICD-10-CM | POA: Insufficient documentation

## 2013-12-05 DIAGNOSIS — R0789 Other chest pain: Secondary | ICD-10-CM | POA: Insufficient documentation

## 2013-12-05 DIAGNOSIS — Z8639 Personal history of other endocrine, nutritional and metabolic disease: Secondary | ICD-10-CM | POA: Diagnosis not present

## 2013-12-05 DIAGNOSIS — R140 Abdominal distension (gaseous): Secondary | ICD-10-CM | POA: Diagnosis not present

## 2013-12-05 DIAGNOSIS — Z8709 Personal history of other diseases of the respiratory system: Secondary | ICD-10-CM | POA: Diagnosis not present

## 2013-12-05 DIAGNOSIS — Z79899 Other long term (current) drug therapy: Secondary | ICD-10-CM | POA: Diagnosis not present

## 2013-12-05 DIAGNOSIS — I10 Essential (primary) hypertension: Secondary | ICD-10-CM | POA: Insufficient documentation

## 2013-12-05 DIAGNOSIS — Z853 Personal history of malignant neoplasm of breast: Secondary | ICD-10-CM | POA: Diagnosis not present

## 2013-12-05 DIAGNOSIS — Z8701 Personal history of pneumonia (recurrent): Secondary | ICD-10-CM | POA: Diagnosis not present

## 2013-12-05 DIAGNOSIS — Z8659 Personal history of other mental and behavioral disorders: Secondary | ICD-10-CM | POA: Insufficient documentation

## 2013-12-05 DIAGNOSIS — R079 Chest pain, unspecified: Secondary | ICD-10-CM | POA: Diagnosis present

## 2013-12-05 DIAGNOSIS — Z7982 Long term (current) use of aspirin: Secondary | ICD-10-CM | POA: Insufficient documentation

## 2013-12-05 DIAGNOSIS — Z87891 Personal history of nicotine dependence: Secondary | ICD-10-CM | POA: Diagnosis not present

## 2013-12-05 DIAGNOSIS — J439 Emphysema, unspecified: Secondary | ICD-10-CM | POA: Diagnosis not present

## 2013-12-05 DIAGNOSIS — R109 Unspecified abdominal pain: Secondary | ICD-10-CM | POA: Diagnosis not present

## 2013-12-05 LAB — CBC
HEMATOCRIT: 45.8 % (ref 36.0–46.0)
HEMOGLOBIN: 15.2 g/dL — AB (ref 12.0–15.0)
MCH: 30.3 pg (ref 26.0–34.0)
MCHC: 33.2 g/dL (ref 30.0–36.0)
MCV: 91.2 fL (ref 78.0–100.0)
PLATELETS: 267 10*3/uL (ref 150–400)
RBC: 5.02 MIL/uL (ref 3.87–5.11)
RDW: 15.8 % — ABNORMAL HIGH (ref 11.5–15.5)
WBC: 9.4 10*3/uL (ref 4.0–10.5)

## 2013-12-05 LAB — COMPREHENSIVE METABOLIC PANEL
ALT: 13 U/L (ref 0–35)
AST: 17 U/L (ref 0–37)
Albumin: 3.8 g/dL (ref 3.5–5.2)
Alkaline Phosphatase: 137 U/L — ABNORMAL HIGH (ref 39–117)
Anion gap: 12 (ref 5–15)
BILIRUBIN TOTAL: 0.2 mg/dL — AB (ref 0.3–1.2)
BUN: 11 mg/dL (ref 6–23)
CHLORIDE: 99 meq/L (ref 96–112)
CO2: 30 meq/L (ref 19–32)
CREATININE: 0.7 mg/dL (ref 0.50–1.10)
Calcium: 10 mg/dL (ref 8.4–10.5)
GFR, EST AFRICAN AMERICAN: 86 mL/min — AB (ref >90)
GFR, EST NON AFRICAN AMERICAN: 74 mL/min — AB (ref >90)
GLUCOSE: 115 mg/dL — AB (ref 70–99)
Potassium: 4.5 mEq/L (ref 3.7–5.3)
Sodium: 141 mEq/L (ref 137–147)
Total Protein: 7.2 g/dL (ref 6.0–8.3)

## 2013-12-05 LAB — PROTIME-INR
INR: 0.92 (ref 0.00–1.49)
PROTHROMBIN TIME: 12.4 s (ref 11.6–15.2)

## 2013-12-05 LAB — TROPONIN I: Troponin I: <0.3 ng/mL (ref <0.30)

## 2013-12-05 MED ORDER — IOHEXOL 350 MG/ML SOLN
80.0000 mL | Freq: Once | INTRAVENOUS | Status: AC | PRN
  Administered 2013-12-05: 80 mL via INTRAVENOUS

## 2013-12-05 MED ORDER — METOPROLOL TARTRATE 1 MG/ML IV SOLN: 5.0000 mg | Freq: Once | INTRAVENOUS | Status: DC

## 2013-12-05 MED ORDER — ASPIRIN 81 MG PO CHEW
324.0000 mg | CHEWABLE_TABLET | Freq: Once | ORAL | Status: AC
  Administered 2013-12-05: 243 mg via ORAL
  Filled 2013-12-05: qty 4

## 2013-12-05 NOTE — ED Notes (Signed)
Patient ambulatory to restroom and back - steady gait  - 1 person assist. Minimal.

## 2013-12-05 NOTE — ED Notes (Signed)
Pt c/o left sided chest pain with "belching all day" also reports increased BP denies h/a

## 2013-12-05 NOTE — ED Provider Notes (Signed)
CSN: [id number]     Arrival date & time 12/05/13  1801 History  This chart was scribed for Monique Essex, MD by Randa Evens, ED Scribe. This patient was seen in room MH11/MH11 and the patient's care was started at [place] PM.      Chief Complaint  Patient presents with  . Chest Pain   The history is provided by the patient. No language interpreter was used.   HPI Comments: Monique Guerra is a 80 y.o. female who presents to the Emergency Department complaining of constant chest tightness onset today that has now resolved. She states that for the past 2 days she has been burping more than usual. Family member states she has been urinating more frequently and been "feeling off since yesterday. She states that her blood pressure was too high prior to arrival but is unable to specify what her blood pressure was. Pt denies any modifying factors. Pt denies any worsening symptoms. Pt states the pain does not radiate. She states she is compliant with taking all her medications but also thinks she is taking too much. Denies HA, SOB, nausea, back pain or abdominal pain. She states that her last cardiac stress test was in January.  Past Medical History  Diagnosis Date  . Breast cancer   . Pneumonia   . Macular degeneration   . Vocal cord paralysis June 2014  . Acid reflux   . Environmental allergies   . COPD (chronic obstructive pulmonary disease)   . Anxiety   . Multiple thyroid nodules June 2014  . Hyperthyroidism    Past Surgical History  Procedure Laterality Date  . Mastectomy    . Tonsillectomy    . Adenoidectomy    . Hip fracture surgery     History reviewed. No pertinent family history. History  Substance Use Topics  . Smoking status: Former Smoker    Quit date: 02/03/1988  . Smokeless tobacco: Not on file  . Alcohol Use: No   OB History    No data available     Review of Systems  Respiratory: Positive for chest tightness. Negative for shortness of breath.    Gastrointestinal: Negative for nausea and abdominal pain.  Musculoskeletal: Negative for back pain.  Neurological: Negative for headaches.   A complete 10 system review of systems was obtained and all systems are negative except as noted in the HPI and PMH.     Allergies  Other and Aminophylline  Home Medications   Prior to Admission medications   Medication Sig Start Date End Date Taking? Authorizing Provider  aspirin 81 MG tablet Take 81 mg by mouth daily.    Historical Provider, MD  Calcium Carbonate-Vitamin D (CALCIUM-D PO) Take by mouth.    Historical Provider, MD  Famotidine (PEPCID PO) Take by mouth.    Historical Provider, MD  lisinopril-hydrochlorothiazide (PRINZIDE,ZESTORETIC) 10-12.5 MG per tablet Take 1 tablet by mouth daily.    Historical Provider, MD  Multiple Vitamin (MULTIVITAMIN) tablet Take 1 tablet by mouth daily.    Historical Provider, MD  pravastatin (PRAVACHOL) 40 MG tablet Take 40 mg by mouth daily.    Historical Provider, MD  sulfamethoxazole-trimethoprim (SEPTRA DS) 800-160 MG per tablet Take 1 tablet by mouth every 12 (twelve) hours. 02/02/12   Malvin Johns, MD   Triage Vitals: BP 165/116 mmHg  Pulse 99  Temp(Src) 97.4 F (36.3 C) (Oral)  Resp 18  Ht 5\' 1"  (1.549 m)  Wt 120 lb (54.432 kg)  BMI 22.69 kg/m2  SpO2  95%  Physical Exam  Constitutional: She is oriented to person, place, and time. She appears well-developed and well-nourished. No distress.  HENT:  Head: Normocephalic and atraumatic.  Mouth/Throat: Oropharynx is clear and moist. No oropharyngeal exudate.  Eyes: Conjunctivae and EOM are normal. Pupils are equal, round, and reactive to light.  Neck: Normal range of motion. Neck supple.  No meningismus.  Cardiovascular: Normal rate, regular rhythm, normal heart sounds and intact distal pulses.   No murmur heard. Equal peripheral pulses.   Pulmonary/Chest: Effort normal and breath sounds normal. No respiratory distress.  Abdominal: Soft.  There is no tenderness. There is no rebound and no guarding.  Musculoskeletal: Normal range of motion. She exhibits no edema or tenderness.  Neurological: She is alert and oriented to person, place, and time. No cranial nerve deficit. She exhibits normal muscle tone. Coordination normal.  No ataxia on finger to nose bilaterally. No pronator drift. 5/5 strength throughout. CN 2-12 intact. Negative Romberg. Equal grip strength. Sensation intact. Gait is normal.   Skin: Skin is warm.  Psychiatric: She has a normal mood and affect. Her behavior is normal.  Nursing note and vitals reviewed.   ED Course  Procedures (including critical care time) DIAGNOSTIC STUDIES: Oxygen Saturation is 95% on RA, adequate by my interpretation.    COORDINATION OF CARE: 6:30 PM-Discussed treatment plan which includes EKG, CBC panel, CMP and Troponin with pt at bedside and pt agreed to plan.     Labs Review Labs Reviewed  CBC - Abnormal; Notable for the following:    Hemoglobin 15.2 (*)    RDW 15.8 (*)    All other components within normal limits  COMPREHENSIVE METABOLIC PANEL - Abnormal; Notable for the following:    Glucose, Bld 115 (*)    Alkaline Phosphatase 137 (*)    Total Bilirubin 0.2 (*)    GFR calc non Af Amer 74 (*)    GFR calc Af Amer 86 (*)    All other components within normal limits  TROPONIN I  PROTIME-INR    Imaging Review Dg Chest 2 View  12/05/2013   CLINICAL DATA:  Belching and increased blood pressure.  EXAM: CHEST  2 VIEW  COMPARISON:  02/06/2013  FINDINGS: Two views of the chest demonstrate hyperinflation. There is no focal airspace disease. Atherosclerotic calcifications in the thoracic aorta. Heart size is normal. No acute bone abnormality.  IMPRESSION: No active cardiopulmonary disease.   Electronically Signed   By: Markus Daft M.D.   On: 12/05/2013 19:36   Ct Angio Chest Pe W/cm &/or Wo Cm  12/05/2013   CLINICAL DATA:  Chest pain with belching and COPD. History of breast  cancer.  EXAM: CT ANGIOGRAPHY CHEST WITH CONTRAST  TECHNIQUE: Multidetector CT imaging of the chest was performed using the standard protocol during bolus administration of intravenous contrast. Multiplanar CT image reconstructions and MIPs were obtained to evaluate the vascular anatomy.  CONTRAST:  58mL OMNIPAQUE IOHEXOL 350 MG/ML SOLN  COMPARISON:  09/02/2012  FINDINGS: Negative for pulmonary embolism. There may be a right thyroid nodule but this is less conspicuous than the previous examination. No evidence for chest lymphadenopathy. Coronary artery calcifications. The ascending thoracic aorta is dilated measuring up to 4.0 cm and previously measured 3.9 cm. No evidence for an aortic dissection. Great vessels are patent. Atherosclerotic calcifications involving the descending thoracic aorta. Again noted are low-density nodules involving the adrenal glands bilaterally. These are nonspecific on postcontrast CT. The adrenal nodules are incompletely imaged but the left  nodule measures roughly 2.4 cm and similar to the previous examination.  The trachea and mainstem bronchi are patent. Patient has centrilobular emphysema. There is a stable 5 mm nodule in the posterior right upper lung on sequence 6, image 17. Stable 3 mm nodule in the left upper lobe on sequence 6, image 20. No evidence for acute airspace disease or consolidation. No acute bone abnormality.  Left mastectomy changes. Again noted is a nodular density in the right breast. This nodule now measures up to 1.5 cm and previously measured 1.2 cm. No significant right axillary lymphadenopathy.  Review of the MIP images confirms the above findings.  IMPRESSION: Negative for pulmonary embolism.  Centrilobular emphysema with two stable small pulmonary nodules. Consider a 12 month followup to ensure greater than 2 years of stability.  History of breast cancer with left mastectomy. There is a nodule in the right breast which measures up to 1.5 cm and there may be  interval growth as described. This could be better evaluated with ultrasound or mammography.  Again noted are low-density lesions involving the adrenal glands. These nodules are indeterminate and incompletely imaged. However, there appears to be minimal change from the previous examination.  Dilatation of the ascending thoracic aorta measuring up to 4 cm.   Electronically Signed   By: Markus Daft M.D.   On: 12/05/2013 22:15     EKG Interpretation   Date/Time:  Tuesday December 05 2013 18:45:03 EST Ventricular Rate:  81 PR Interval:  144 QRS Duration: 82 QT Interval:  364 QTC Calculation: 422 R Axis:   -45 Text Interpretation:  Normal sinus rhythm Left axis deviation Anteroseptal  infarct , age undetermined Abnormal ECG No significant change was found  Confirmed by Wyvonnia Dusky  MD, Elon Eoff [phone]) on 12/05/2013 7:02:38 PM      MDM   Final diagnoses:  Chest pain  Hypertensive urgency    Patient with multiple complaints. Ongoing chest tightness all day long with belching. States blood pressure has been elevated at home. Denies headache. Denies shortness of breath, nausea vomiting or vision change.  EKG was stable anterior Q waves. Hypertensive to 180/100. States her medications have been adjusted by her PCP.  Troponin negative. No further chest pain. CT negative for PE.  Patient states scheduled for mammogram tomorrow. Thoracic aorta dilation.  D/w Dr. Jessee Avers at [facility] who accepts patient for chest pain rule out. BP improved to 846K systolic.  Remains stable at time of transfer.  I personally performed the services described in this documentation, which was scribed in my presence. The recorded information has been reviewed and is accurate.   Monique Essex, MD 12/06/13 [phone]

## 2013-12-06 DIAGNOSIS — Z7982 Long term (current) use of aspirin: Secondary | ICD-10-CM | POA: Diagnosis not present

## 2013-12-06 DIAGNOSIS — Z9012 Acquired absence of left breast and nipple: Secondary | ICD-10-CM | POA: Diagnosis not present

## 2013-12-06 DIAGNOSIS — Z8659 Personal history of other mental and behavioral disorders: Secondary | ICD-10-CM | POA: Diagnosis not present

## 2013-12-06 DIAGNOSIS — R0602 Shortness of breath: Secondary | ICD-10-CM | POA: Diagnosis not present

## 2013-12-06 DIAGNOSIS — Z87891 Personal history of nicotine dependence: Secondary | ICD-10-CM | POA: Diagnosis not present

## 2013-12-06 DIAGNOSIS — J432 Centrilobular emphysema: Secondary | ICD-10-CM | POA: Diagnosis not present

## 2013-12-06 DIAGNOSIS — Z8639 Personal history of other endocrine, nutritional and metabolic disease: Secondary | ICD-10-CM | POA: Diagnosis not present

## 2013-12-06 DIAGNOSIS — J449 Chronic obstructive pulmonary disease, unspecified: Secondary | ICD-10-CM | POA: Diagnosis not present

## 2013-12-06 DIAGNOSIS — Z8701 Personal history of pneumonia (recurrent): Secondary | ICD-10-CM | POA: Diagnosis not present

## 2013-12-06 DIAGNOSIS — R0789 Other chest pain: Secondary | ICD-10-CM | POA: Diagnosis not present

## 2013-12-06 DIAGNOSIS — Z8669 Personal history of other diseases of the nervous system and sense organs: Secondary | ICD-10-CM | POA: Diagnosis not present

## 2013-12-06 DIAGNOSIS — Z79899 Other long term (current) drug therapy: Secondary | ICD-10-CM | POA: Diagnosis not present

## 2013-12-06 DIAGNOSIS — I10 Essential (primary) hypertension: Secondary | ICD-10-CM | POA: Diagnosis not present

## 2013-12-06 DIAGNOSIS — R079 Chest pain, unspecified: Secondary | ICD-10-CM | POA: Diagnosis not present

## 2013-12-06 DIAGNOSIS — N63 Unspecified lump in breast: Secondary | ICD-10-CM | POA: Diagnosis not present

## 2013-12-06 DIAGNOSIS — Z8709 Personal history of other diseases of the respiratory system: Secondary | ICD-10-CM | POA: Diagnosis not present

## 2013-12-06 DIAGNOSIS — Z853 Personal history of malignant neoplasm of breast: Secondary | ICD-10-CM | POA: Diagnosis not present

## 2013-12-06 DIAGNOSIS — R918 Other nonspecific abnormal finding of lung field: Secondary | ICD-10-CM | POA: Diagnosis not present

## 2013-12-06 DIAGNOSIS — K219 Gastro-esophageal reflux disease without esophagitis: Secondary | ICD-10-CM | POA: Diagnosis not present

## 2013-12-15 DIAGNOSIS — N63 Unspecified lump in breast: Secondary | ICD-10-CM | POA: Diagnosis not present

## 2013-12-15 DIAGNOSIS — I10 Essential (primary) hypertension: Secondary | ICD-10-CM | POA: Diagnosis not present

## 2013-12-15 DIAGNOSIS — F411 Generalized anxiety disorder: Secondary | ICD-10-CM | POA: Diagnosis not present

## 2013-12-15 DIAGNOSIS — J449 Chronic obstructive pulmonary disease, unspecified: Secondary | ICD-10-CM | POA: Diagnosis not present

## 2013-12-15 DIAGNOSIS — Z853 Personal history of malignant neoplasm of breast: Secondary | ICD-10-CM | POA: Diagnosis not present

## 2013-12-15 DIAGNOSIS — I700 Atherosclerosis of aorta: Secondary | ICD-10-CM | POA: Diagnosis not present

## 2013-12-15 DIAGNOSIS — R410 Disorientation, unspecified: Secondary | ICD-10-CM | POA: Diagnosis not present

## 2013-12-15 DIAGNOSIS — R911 Solitary pulmonary nodule: Secondary | ICD-10-CM | POA: Diagnosis not present

## 2014-03-13 DIAGNOSIS — H43813 Vitreous degeneration, bilateral: Secondary | ICD-10-CM | POA: Diagnosis not present

## 2014-03-13 DIAGNOSIS — H3531 Nonexudative age-related macular degeneration: Secondary | ICD-10-CM | POA: Diagnosis not present

## 2014-03-13 DIAGNOSIS — H3532 Exudative age-related macular degeneration: Secondary | ICD-10-CM | POA: Diagnosis not present

## 2014-03-26 DIAGNOSIS — R809 Proteinuria, unspecified: Secondary | ICD-10-CM | POA: Diagnosis not present

## 2014-03-26 DIAGNOSIS — J449 Chronic obstructive pulmonary disease, unspecified: Secondary | ICD-10-CM | POA: Diagnosis not present

## 2014-03-26 DIAGNOSIS — I10 Essential (primary) hypertension: Secondary | ICD-10-CM | POA: Diagnosis not present

## 2014-03-26 DIAGNOSIS — F411 Generalized anxiety disorder: Secondary | ICD-10-CM | POA: Diagnosis not present

## 2014-03-26 DIAGNOSIS — I700 Atherosclerosis of aorta: Secondary | ICD-10-CM | POA: Diagnosis not present

## 2014-03-26 DIAGNOSIS — Z8639 Personal history of other endocrine, nutritional and metabolic disease: Secondary | ICD-10-CM | POA: Diagnosis not present

## 2014-04-10 DIAGNOSIS — H43813 Vitreous degeneration, bilateral: Secondary | ICD-10-CM | POA: Diagnosis not present

## 2014-04-10 DIAGNOSIS — H3531 Nonexudative age-related macular degeneration: Secondary | ICD-10-CM | POA: Diagnosis not present

## 2014-04-10 DIAGNOSIS — H3532 Exudative age-related macular degeneration: Secondary | ICD-10-CM | POA: Diagnosis not present

## 2014-04-11 DIAGNOSIS — E042 Nontoxic multinodular goiter: Secondary | ICD-10-CM | POA: Diagnosis not present

## 2014-04-11 DIAGNOSIS — E279 Disorder of adrenal gland, unspecified: Secondary | ICD-10-CM | POA: Diagnosis not present

## 2014-04-11 DIAGNOSIS — E0590 Thyrotoxicosis, unspecified without thyrotoxic crisis or storm: Secondary | ICD-10-CM | POA: Diagnosis not present

## 2014-04-19 DIAGNOSIS — J301 Allergic rhinitis due to pollen: Secondary | ICD-10-CM | POA: Diagnosis not present

## 2014-04-19 DIAGNOSIS — J3801 Paralysis of vocal cords and larynx, unilateral: Secondary | ICD-10-CM | POA: Diagnosis not present

## 2014-04-19 DIAGNOSIS — H6123 Impacted cerumen, bilateral: Secondary | ICD-10-CM | POA: Diagnosis not present

## 2014-05-08 DIAGNOSIS — H43813 Vitreous degeneration, bilateral: Secondary | ICD-10-CM | POA: Diagnosis not present

## 2014-05-08 DIAGNOSIS — H3532 Exudative age-related macular degeneration: Secondary | ICD-10-CM | POA: Diagnosis not present

## 2014-05-08 DIAGNOSIS — H3531 Nonexudative age-related macular degeneration: Secondary | ICD-10-CM | POA: Diagnosis not present

## 2014-06-05 DIAGNOSIS — H3532 Exudative age-related macular degeneration: Secondary | ICD-10-CM | POA: Diagnosis not present

## 2014-06-05 DIAGNOSIS — H3531 Nonexudative age-related macular degeneration: Secondary | ICD-10-CM | POA: Diagnosis not present

## 2014-06-05 DIAGNOSIS — H43813 Vitreous degeneration, bilateral: Secondary | ICD-10-CM | POA: Diagnosis not present

## 2014-06-11 DIAGNOSIS — E0590 Thyrotoxicosis, unspecified without thyrotoxic crisis or storm: Secondary | ICD-10-CM | POA: Diagnosis not present

## 2014-07-17 DIAGNOSIS — H43813 Vitreous degeneration, bilateral: Secondary | ICD-10-CM | POA: Diagnosis not present

## 2014-07-17 DIAGNOSIS — H3532 Exudative age-related macular degeneration: Secondary | ICD-10-CM | POA: Diagnosis not present

## 2014-07-17 DIAGNOSIS — H3531 Nonexudative age-related macular degeneration: Secondary | ICD-10-CM | POA: Diagnosis not present

## 2014-07-17 DIAGNOSIS — H35423 Microcystoid degeneration of retina, bilateral: Secondary | ICD-10-CM | POA: Diagnosis not present

## 2014-08-20 ENCOUNTER — Emergency Department (HOSPITAL_BASED_OUTPATIENT_CLINIC_OR_DEPARTMENT_OTHER)
Admission: EM | Admit: 2014-08-20 | Discharge: 2014-08-20 | Disposition: A | Discharge status: Home / Self Care | Payer: Medicare Other | Attending: Emergency Medicine | Admitting: Emergency Medicine

## 2014-08-20 ENCOUNTER — Emergency Department (HOSPITAL_BASED_OUTPATIENT_CLINIC_OR_DEPARTMENT_OTHER): Payer: Medicare Other

## 2014-08-20 ENCOUNTER — Encounter (HOSPITAL_BASED_OUTPATIENT_CLINIC_OR_DEPARTMENT_OTHER): Payer: Self-pay | Admitting: *Deleted

## 2014-08-20 DIAGNOSIS — N390 Urinary tract infection, site not specified: Secondary | ICD-10-CM

## 2014-08-20 DIAGNOSIS — Z79899 Other long term (current) drug therapy: Secondary | ICD-10-CM | POA: Diagnosis not present

## 2014-08-20 DIAGNOSIS — Z8659 Personal history of other mental and behavioral disorders: Secondary | ICD-10-CM | POA: Diagnosis not present

## 2014-08-20 DIAGNOSIS — J441 Chronic obstructive pulmonary disease with (acute) exacerbation: Secondary | ICD-10-CM | POA: Insufficient documentation

## 2014-08-20 DIAGNOSIS — K219 Gastro-esophageal reflux disease without esophagitis: Secondary | ICD-10-CM | POA: Insufficient documentation

## 2014-08-20 DIAGNOSIS — Z7982 Long term (current) use of aspirin: Secondary | ICD-10-CM | POA: Diagnosis not present

## 2014-08-20 DIAGNOSIS — R300 Dysuria: Secondary | ICD-10-CM | POA: Diagnosis present

## 2014-08-20 DIAGNOSIS — J439 Emphysema, unspecified: Secondary | ICD-10-CM | POA: Diagnosis not present

## 2014-08-20 DIAGNOSIS — I10 Essential (primary) hypertension: Secondary | ICD-10-CM | POA: Insufficient documentation

## 2014-08-20 DIAGNOSIS — R05 Cough: Secondary | ICD-10-CM | POA: Diagnosis not present

## 2014-08-20 DIAGNOSIS — Z8639 Personal history of other endocrine, nutritional and metabolic disease: Secondary | ICD-10-CM | POA: Diagnosis not present

## 2014-08-20 DIAGNOSIS — Z87891 Personal history of nicotine dependence: Secondary | ICD-10-CM | POA: Insufficient documentation

## 2014-08-20 DIAGNOSIS — Z8701 Personal history of pneumonia (recurrent): Secondary | ICD-10-CM | POA: Diagnosis not present

## 2014-08-20 DIAGNOSIS — Z853 Personal history of malignant neoplasm of breast: Secondary | ICD-10-CM | POA: Insufficient documentation

## 2014-08-20 DIAGNOSIS — R059 Cough, unspecified: Secondary | ICD-10-CM

## 2014-08-20 LAB — URINALYSIS, ROUTINE W REFLEX MICROSCOPIC
Bilirubin Urine: NEGATIVE
GLUCOSE, UA: NEGATIVE mg/dL
KETONES UR: NEGATIVE mg/dL
Nitrite: NEGATIVE
Protein, ur: NEGATIVE mg/dL
Specific Gravity, Urine: 1.005 (ref 1.005–1.030)
UROBILINOGEN UA: 0.2 mg/dL (ref 0.0–1.0)
pH: 7 (ref 5.0–8.0)

## 2014-08-20 LAB — URINE MICROSCOPIC-ADD ON

## 2014-08-20 MED ORDER — CEPHALEXIN 500 MG PO CAPS
500.0000 mg | ORAL_CAPSULE | Freq: Three times a day (TID) | ORAL | Status: DC
Start: 2014-08-20 — End: 2015-01-22

## 2014-08-20 NOTE — ED Provider Notes (Signed)
CSN: [id number]     Arrival date & time 08/20/14  1205 History   First MD Initiated Contact with Patient 08/20/14 1236     Chief Complaint  Patient presents with  . Hypertension     (Consider location/radiation/quality/duration/timing/severity/associated sxs/prior Treatment) Patient is a 81 y.o. female presenting with hypertension and dysuria. The history is provided by the patient.  Hypertension Associated symptoms include shortness of breath (chronic and unchanged). Pertinent negatives include no chest pain, no abdominal pain and no headaches.  Dysuria Pain quality:  Burning Pain severity:  Mild Onset quality:  Sudden Duration:  1 day Timing:  Constant Progression:  Unchanged Chronicity:  New Recent urinary tract infections: no   Relieved by:  Nothing Worsened by:  Nothing tried Ineffective treatments:  None tried Urinary symptoms: frequent urination and incontinence   Associated symptoms: no abdominal pain, no fever, no nausea and no vomiting     Past Medical History  Diagnosis Date  . Breast cancer   . Pneumonia   . Macular degeneration   . Vocal cord paralysis June 2014  . Acid reflux   . Environmental allergies   . COPD (chronic obstructive pulmonary disease)   . Anxiety   . Multiple thyroid nodules June 2014  . Hyperthyroidism    Past Surgical History  Procedure Laterality Date  . Mastectomy    . Tonsillectomy    . Adenoidectomy    . Hip fracture surgery     No family history on file. History  Substance Use Topics  . Smoking status: Former Smoker    Quit date: 02/03/1988  . Smokeless tobacco: Not on file  . Alcohol Use: No   OB History    No data available     Review of Systems  Constitutional: Negative for fever and fatigue.  HENT: Negative for congestion and drooling.   Eyes: Negative for pain.  Respiratory: Positive for cough and shortness of breath (chronic and unchanged).   Cardiovascular: Negative for chest pain.  Gastrointestinal:  Negative for nausea, vomiting, abdominal pain and diarrhea.  Genitourinary: Positive for dysuria and frequency. Negative for hematuria.  Musculoskeletal: Negative for back pain, gait problem and neck pain.  Skin: Negative for color change.  Neurological: Negative for dizziness and headaches.  Hematological: Negative for adenopathy.  Psychiatric/Behavioral: Negative for behavioral problems.  All other systems reviewed and are negative.     Allergies  Other and Aminophylline  Home Medications   Prior to Admission medications   Medication Sig Start Date End Date Taking? Authorizing Provider  aspirin 81 MG tablet Take 81 mg by mouth daily.    Historical Provider, MD  Calcium Carbonate-Vitamin D (CALCIUM-D PO) Take by mouth.    Historical Provider, MD  Famotidine (PEPCID PO) Take by mouth.    Historical Provider, MD  lisinopril-hydrochlorothiazide (PRINZIDE,ZESTORETIC) 10-12.5 MG per tablet Take 1 tablet by mouth daily.    Historical Provider, MD  Multiple Vitamin (MULTIVITAMIN) tablet Take 1 tablet by mouth daily.    Historical Provider, MD  pravastatin (PRAVACHOL) 40 MG tablet Take 40 mg by mouth daily.    Historical Provider, MD  sulfamethoxazole-trimethoprim (SEPTRA DS) 800-160 MG per tablet Take 1 tablet by mouth every 12 (twelve) hours. 02/02/12   Malvin Johns, MD   BP 148/91 mmHg  Pulse 93  Temp(Src) 98.1 F (36.7 C) (Oral)  Resp 16  Ht 5\' 1"  (1.549 m)  Wt 120 lb (54.432 kg)  BMI 22.69 kg/m2  SpO2 94% Physical Exam  Constitutional: She is oriented  to person, place, and time. She appears well-developed and well-nourished.  HENT:  Head: Normocephalic.  Mouth/Throat: Oropharynx is clear and moist. No oropharyngeal exudate.  Eyes: Conjunctivae and EOM are normal. Pupils are equal, round, and reactive to light.  Neck: Normal range of motion. Neck supple.  Cardiovascular: Normal rate, regular rhythm, normal heart sounds and intact distal pulses.  Exam reveals no gallop and no  friction rub.   No murmur heard. Pulmonary/Chest: Effort normal and breath sounds normal. No respiratory distress. She has no wheezes.  Abdominal: Soft. Bowel sounds are normal. There is no tenderness. There is no rebound and no guarding.  Musculoskeletal: Normal range of motion. She exhibits no edema or tenderness.  Neurological: She is alert and oriented to person, place, and time.  alert, oriented x3 speech: normal in context and clarity memory: intact grossly cranial nerves II-XII: intact motor strength: full proximally and distally no involuntary movements or tremors sensation: intact to light touch diffusely  cerebellar: finger-to-nose and heel-to-shin intact gait: normal  Skin: Skin is warm and dry.  Psychiatric: She has a normal mood and affect. Her behavior is normal.  Nursing note and vitals reviewed.   ED Course  Procedures (including critical care time) Labs Review Labs Reviewed  URINALYSIS, ROUTINE W REFLEX MICROSCOPIC (NOT AT [facility]) - Abnormal; Notable for the following:    APPearance CLOUDY (*)    Hgb urine dipstick MODERATE (*)    Leukocytes, UA LARGE (*)    All other components within normal limits  URINE MICROSCOPIC-ADD ON - Abnormal; Notable for the following:    Squamous Epithelial / LPF FEW (*)    Bacteria, UA FEW (*)    All other components within normal limits  URINE CULTURE    Imaging Review Dg Chest 2 View  08/20/2014   CLINICAL DATA:  Hypertension.  Tingling sensation.  EXAM: CHEST  2 VIEW  COMPARISON:  12/05/2013.  FINDINGS: Artifact overlies chest. Heart size is normal. There is atherosclerosis of the aorta. The lungs are hyperinflated. No infiltrate, mass, effusion or collapse. No pulmonary edema. No acute bone finding.  IMPRESSION: Emphysema.  Atherosclerosis of the aorta.  No acute finding.   Electronically Signed   By: Nelson Chimes M.D.   On: 08/20/2014 13:11     EKG Interpretation   Date/Time:  Monday August 20 2014 12:20:32 EDT Ventricular  Rate:  100 PR Interval:  140 QRS Duration: 72 QT Interval:  356 QTC Calculation: 459 R Axis:   -62 Text Interpretation:  Sinus rhythm with Premature atrial complexes  Possible Left atrial enlargement Left axis deviation Septal infarct , age  undetermined No significant change since last tracing Confirmed by  Hudsen Fei  MD, Dametri Ozburn ([id number]) on 08/20/2014 12:24:07 PM      MDM   Final diagnoses:  Cough  UTI (lower urinary tract infection)    1:05 PM 81 y.o. female w hx of breast cancer  Who presents with dysuria and urinary frequency since yesterday. She also notes 2 episodes of urinary incontinence which she has not had previously. She states that she has also had some sinus drainage and a cough productive of some nonspecific sputum. She has chronic shortness of breath which is unchanged. She denies chest pain. She notes that her blood pressure was high last night when she was having the urinary frequency with a systolic in the 440H. She was concerned for stroke due to this. She denies any new weakness or numbness. She is alert and oriented 3 with a  normal neurologic exam. Vital signs unremarkable here. Do not think patient needs workup for stroke. We'll get screening chest x-ray and urinalysis.  1:57 PM: will tx for UTI. Pt well appearing.  I have discussed the diagnosis/risks/treatment options with the patient and family and believe the pt to be eligible for discharge home to follow-up with her pcp. We also discussed returning to the ED immediately if new or worsening sx occur. We discussed the sx which are most concerning (e.g., fever, abd pain, AMS) that necessitate immediate return. Medications administered to the patient during their visit and any new prescriptions provided to the patient are listed below.  Medications given during this visit Medications - No data to display  New Prescriptions   CEPHALEXIN (KEFLEX) 500 MG CAPSULE    Take 1 capsule (500 mg total) by mouth 3 (three) times  daily.     Pamella Pert, MD 08/20/14 [phone]

## 2014-08-20 NOTE — ED Notes (Signed)
Came to the ED with high BP per her home BP device. C.o possible UTI. After further interview pt states she also wants to be checked for possible stroke. Feels tingly all over. She is alert, moving all extremities without difficulty and is ambulatory with a cane.

## 2014-08-23 LAB — URINE CULTURE: Culture: 60000

## 2014-08-24 ENCOUNTER — Telehealth (HOSPITAL_COMMUNITY): Payer: Self-pay

## 2014-08-24 NOTE — ED Notes (Signed)
Post ED Visit - Positive Culture Follow-up  Culture report reviewed by antimicrobial stewardship pharmacist: []  Wes Mamers, Pharm.D., BCPS [x]  Heide Guile, Pharm.D., BCPS []  Alycia Rossetti, Pharm.D., BCPS []  [place], Pharm.D., BCPS, AAHIVP []  Legrand Como, Pharm.D., BCPS, AAHIVP []  Isac Sarna, Pharm.D., BCPS  Positive urine culture Treated with cephalexin, organism sensitive to the same and no further patient follow-up is required at this time.  Monique Guerra 08/24/2014, 8:50 AM

## 2014-08-28 DIAGNOSIS — R300 Dysuria: Secondary | ICD-10-CM | POA: Diagnosis not present

## 2014-10-02 DIAGNOSIS — H3532 Exudative age-related macular degeneration: Secondary | ICD-10-CM | POA: Diagnosis not present

## 2014-10-02 DIAGNOSIS — H3531 Nonexudative age-related macular degeneration: Secondary | ICD-10-CM | POA: Diagnosis not present

## 2014-10-16 DIAGNOSIS — E0590 Thyrotoxicosis, unspecified without thyrotoxic crisis or storm: Secondary | ICD-10-CM | POA: Diagnosis not present

## 2014-10-22 DIAGNOSIS — Z23 Encounter for immunization: Secondary | ICD-10-CM | POA: Diagnosis not present

## 2014-12-11 DIAGNOSIS — H43813 Vitreous degeneration, bilateral: Secondary | ICD-10-CM | POA: Diagnosis not present

## 2014-12-11 DIAGNOSIS — H353124 Nonexudative age-related macular degeneration, left eye, advanced atrophic with subfoveal involvement: Secondary | ICD-10-CM | POA: Diagnosis not present

## 2014-12-11 DIAGNOSIS — H353211 Exudative age-related macular degeneration, right eye, with active choroidal neovascularization: Secondary | ICD-10-CM | POA: Diagnosis not present

## 2014-12-12 DIAGNOSIS — E041 Nontoxic single thyroid nodule: Secondary | ICD-10-CM | POA: Diagnosis not present

## 2014-12-12 DIAGNOSIS — H3530 Unspecified macular degeneration: Secondary | ICD-10-CM | POA: Diagnosis not present

## 2014-12-12 DIAGNOSIS — F411 Generalized anxiety disorder: Secondary | ICD-10-CM | POA: Diagnosis not present

## 2014-12-12 DIAGNOSIS — Z0000 Encounter for general adult medical examination without abnormal findings: Secondary | ICD-10-CM | POA: Diagnosis not present

## 2014-12-12 DIAGNOSIS — I700 Atherosclerosis of aorta: Secondary | ICD-10-CM | POA: Diagnosis not present

## 2014-12-12 DIAGNOSIS — I10 Essential (primary) hypertension: Secondary | ICD-10-CM | POA: Diagnosis not present

## 2014-12-12 DIAGNOSIS — E782 Mixed hyperlipidemia: Secondary | ICD-10-CM | POA: Diagnosis not present

## 2014-12-12 DIAGNOSIS — J449 Chronic obstructive pulmonary disease, unspecified: Secondary | ICD-10-CM | POA: Diagnosis not present

## 2014-12-20 DIAGNOSIS — B079 Viral wart, unspecified: Secondary | ICD-10-CM | POA: Diagnosis not present

## 2015-01-15 DIAGNOSIS — J209 Acute bronchitis, unspecified: Secondary | ICD-10-CM | POA: Diagnosis not present

## 2015-01-22 ENCOUNTER — Emergency Department (HOSPITAL_BASED_OUTPATIENT_CLINIC_OR_DEPARTMENT_OTHER): Payer: Medicare Other

## 2015-01-22 ENCOUNTER — Emergency Department (HOSPITAL_BASED_OUTPATIENT_CLINIC_OR_DEPARTMENT_OTHER)
Admission: EM | Admit: 2015-01-22 | Discharge: 2015-01-22 | Disposition: A | Discharge status: Home / Self Care | Payer: Medicare Other | Attending: Emergency Medicine | Admitting: Emergency Medicine

## 2015-01-22 ENCOUNTER — Encounter (HOSPITAL_BASED_OUTPATIENT_CLINIC_OR_DEPARTMENT_OTHER): Payer: Self-pay | Admitting: Emergency Medicine

## 2015-01-22 DIAGNOSIS — Z853 Personal history of malignant neoplasm of breast: Secondary | ICD-10-CM | POA: Diagnosis not present

## 2015-01-22 DIAGNOSIS — Z8669 Personal history of other diseases of the nervous system and sense organs: Secondary | ICD-10-CM | POA: Diagnosis not present

## 2015-01-22 DIAGNOSIS — Z79899 Other long term (current) drug therapy: Secondary | ICD-10-CM | POA: Insufficient documentation

## 2015-01-22 DIAGNOSIS — N390 Urinary tract infection, site not specified: Secondary | ICD-10-CM | POA: Diagnosis not present

## 2015-01-22 DIAGNOSIS — Z792 Long term (current) use of antibiotics: Secondary | ICD-10-CM | POA: Diagnosis not present

## 2015-01-22 DIAGNOSIS — Z8639 Personal history of other endocrine, nutritional and metabolic disease: Secondary | ICD-10-CM | POA: Insufficient documentation

## 2015-01-22 DIAGNOSIS — J449 Chronic obstructive pulmonary disease, unspecified: Secondary | ICD-10-CM | POA: Diagnosis not present

## 2015-01-22 DIAGNOSIS — Z8701 Personal history of pneumonia (recurrent): Secondary | ICD-10-CM | POA: Insufficient documentation

## 2015-01-22 DIAGNOSIS — Z87891 Personal history of nicotine dependence: Secondary | ICD-10-CM | POA: Insufficient documentation

## 2015-01-22 DIAGNOSIS — R05 Cough: Secondary | ICD-10-CM | POA: Diagnosis not present

## 2015-01-22 DIAGNOSIS — Z8719 Personal history of other diseases of the digestive system: Secondary | ICD-10-CM | POA: Insufficient documentation

## 2015-01-22 DIAGNOSIS — Z7982 Long term (current) use of aspirin: Secondary | ICD-10-CM | POA: Insufficient documentation

## 2015-01-22 DIAGNOSIS — R300 Dysuria: Secondary | ICD-10-CM | POA: Diagnosis present

## 2015-01-22 LAB — CBC WITH DIFFERENTIAL/PLATELET
BASOS PCT: 0 %
Basophils Absolute: 0 10*3/uL (ref 0.0–0.1)
EOS PCT: 1 %
Eosinophils Absolute: 0.1 10*3/uL (ref 0.0–0.7)
HEMATOCRIT: 46.3 % — AB (ref 36.0–46.0)
Hemoglobin: 15.2 g/dL — ABNORMAL HIGH (ref 12.0–15.0)
Lymphocytes Relative: 13 %
Lymphs Abs: 1.9 10*3/uL (ref 0.7–4.0)
MCH: 29.5 pg (ref 26.0–34.0)
MCHC: 32.8 g/dL (ref 30.0–36.0)
MCV: 89.9 fL (ref 78.0–100.0)
MONO ABS: 0.9 10*3/uL (ref 0.1–1.0)
MONOS PCT: 6 %
NEUTROS ABS: 12.4 10*3/uL — AB (ref 1.7–7.7)
Neutrophils Relative %: 80 %
PLATELETS: 261 10*3/uL (ref 150–400)
RBC: 5.15 MIL/uL — ABNORMAL HIGH (ref 3.87–5.11)
RDW: 16.5 % — AB (ref 11.5–15.5)
WBC: 15.5 10*3/uL — ABNORMAL HIGH (ref 4.0–10.5)

## 2015-01-22 LAB — COMPREHENSIVE METABOLIC PANEL
ALK PHOS: 117 U/L (ref 38–126)
ALT: 16 U/L (ref 14–54)
ANION GAP: 9 (ref 5–15)
AST: 24 U/L (ref 15–41)
Albumin: 4.2 g/dL (ref 3.5–5.0)
BILIRUBIN TOTAL: 0.7 mg/dL (ref 0.3–1.2)
BUN: 14 mg/dL (ref 6–20)
CALCIUM: 9.7 mg/dL (ref 8.9–10.3)
CO2: 27 mmol/L (ref 22–32)
Chloride: 102 mmol/L (ref 101–111)
Creatinine, Ser: 0.68 mg/dL (ref 0.44–1.00)
GFR calc Af Amer: >60 mL/min (ref >60)
GFR calc non Af Amer: >60 mL/min (ref >60)
GLUCOSE: 115 mg/dL — AB (ref 65–99)
Potassium: 4 mmol/L (ref 3.5–5.1)
SODIUM: 138 mmol/L (ref 135–145)
TOTAL PROTEIN: 6.9 g/dL (ref 6.5–8.1)

## 2015-01-22 LAB — URINE MICROSCOPIC-ADD ON

## 2015-01-22 LAB — URINALYSIS, ROUTINE W REFLEX MICROSCOPIC
BILIRUBIN URINE: NEGATIVE
Glucose, UA: NEGATIVE mg/dL
Ketones, ur: NEGATIVE mg/dL
Nitrite: NEGATIVE
PH: 7 (ref 5.0–8.0)
Protein, ur: 100 mg/dL — AB
SPECIFIC GRAVITY, URINE: 1.01 (ref 1.005–1.030)

## 2015-01-22 LAB — I-STAT CG4 LACTIC ACID, ED: Lactic Acid, Venous: 1.01 mmol/L (ref 0.5–2.0)

## 2015-01-22 MED ORDER — CEFTRIAXONE SODIUM 1 G IJ SOLR
INTRAMUSCULAR | Status: AC
  Filled 2015-01-22: qty 10

## 2015-01-22 MED ORDER — SODIUM CHLORIDE 0.9 % IV BOLUS (SEPSIS)
1000.0000 mL | Freq: Once | INTRAVENOUS | Status: AC
  Administered 2015-01-22: 1000 mL via INTRAVENOUS

## 2015-01-22 MED ORDER — DEXTROSE 5 % IV SOLN
1.0000 g | Freq: Once | INTRAVENOUS | Status: AC
  Administered 2015-01-22: 1 g via INTRAVENOUS

## 2015-01-22 MED ORDER — CEPHALEXIN 500 MG PO CAPS: 500.0000 mg | ORAL_CAPSULE | Freq: Three times a day (TID) | ORAL | Status: DC

## 2015-01-22 NOTE — ED Provider Notes (Signed)
CSN: [id number]     Arrival date & time 01/22/15  1435 History   First MD Initiated Contact with Patient 01/22/15 1459     Chief Complaint  Patient presents with  . Urinary Tract Infection     (Consider location/radiation/quality/duration/timing/severity/associated sxs/prior Treatment) HPI  81 year old female presents with burning with urination this morning. She notes that one week ago she was placed on amoxicillin for a "sinus infection". She feels like her cough and upper respiratory symptoms are improving but not gone. She has a history of COPD and states she is always short of breath but not worse currently. Denies any fevers. Denies vomiting but states she was spitting up a lot of things earlier. She tells me she has a history of high heart rate although she thinks this is intermittent. No current vomiting or diarrhea. Denies abdominal pain or current back pain, states mostly she's having the burning with urination since this morning.   Past Medical History  Diagnosis Date  . Breast cancer ([place])   . Pneumonia   . Macular degeneration   . Vocal cord paralysis June 2014  . Acid reflux   . Environmental allergies   . COPD (chronic obstructive pulmonary disease) ([place])   . Anxiety   . Multiple thyroid nodules June 2014  . Hyperthyroidism    Past Surgical History  Procedure Laterality Date  . Mastectomy    . Tonsillectomy    . Adenoidectomy    . Hip fracture surgery     History reviewed. No pertinent family history. Social History  Substance Use Topics  . Smoking status: Former Smoker    Quit date: 02/03/1988  . Smokeless tobacco: None  . Alcohol Use: No   OB History    No data available     Review of Systems  Constitutional: Negative for fever.  HENT: Positive for congestion.   Respiratory: Positive for cough. Negative for shortness of breath.   Gastrointestinal: Negative for vomiting and abdominal pain.  Genitourinary: Positive for dysuria.  All other systems  reviewed and are negative.     Allergies  Other and Aminophylline  Home Medications   Prior to Admission medications   Medication Sig Start Date End Date Taking? Authorizing Provider  aspirin 81 MG tablet Take 81 mg by mouth daily.    Historical Provider, MD  Calcium Carbonate-Vitamin D (CALCIUM-D PO) Take by mouth.    Historical Provider, MD  cephALEXin (KEFLEX) 500 MG capsule Take 1 capsule (500 mg total) by mouth 3 (three) times daily. 08/20/14   Pamella Pert, MD  Famotidine (PEPCID PO) Take by mouth.    Historical Provider, MD  lisinopril-hydrochlorothiazide (PRINZIDE,ZESTORETIC) 10-12.5 MG per tablet Take 1 tablet by mouth daily.    Historical Provider, MD  Multiple Vitamin (MULTIVITAMIN) tablet Take 1 tablet by mouth daily.    Historical Provider, MD  pravastatin (PRAVACHOL) 40 MG tablet Take 40 mg by mouth daily.    Historical Provider, MD  sulfamethoxazole-trimethoprim (SEPTRA DS) 800-160 MG per tablet Take 1 tablet by mouth every 12 (twelve) hours. 02/02/12   Malvin Johns, MD   BP 171/92 mmHg  Pulse 119  Temp(Src) 97.9 F (36.6 C)  Resp 18  Ht 5\' 1"  (1.549 m)  Wt 120 lb (54.432 kg)  BMI 22.69 kg/m2  SpO2 92% Physical Exam  Constitutional: She is oriented to person, place, and time. She appears well-developed and well-nourished. No distress.  HENT:  Head: Normocephalic and atraumatic.  Right Ear: External ear normal.  Left Ear: External  ear normal.  Nose: Nose normal.  Eyes: Right eye exhibits no discharge. Left eye exhibits no discharge.  Cardiovascular: Regular rhythm and normal heart sounds.  Tachycardia present.   Pulmonary/Chest: Effort normal and breath sounds normal. She has no wheezes.  Abdominal: Soft. She exhibits no distension. There is no tenderness. There is no CVA tenderness.  Neurological: She is alert and oriented to person, place, and time.  Skin: Skin is warm and dry. She is not diaphoretic.  Nursing note and vitals reviewed.   ED Course   Procedures (including critical care time) Labs Review Labs Reviewed  URINALYSIS, ROUTINE W REFLEX MICROSCOPIC (NOT AT [facility]) - Abnormal; Notable for the following:    APPearance TURBID (*)    Hgb urine dipstick LARGE (*)    Protein, ur 100 (*)    Leukocytes, UA LARGE (*)    All other components within normal limits  COMPREHENSIVE METABOLIC PANEL - Abnormal; Notable for the following:    Glucose, Bld 115 (*)    All other components within normal limits  CBC WITH DIFFERENTIAL/PLATELET - Abnormal; Notable for the following:    WBC 15.5 (*)    RBC 5.15 (*)    Hemoglobin 15.2 (*)    HCT 46.3 (*)    RDW 16.5 (*)    Neutro Abs 12.4 (*)    All other components within normal limits  URINE MICROSCOPIC-ADD ON - Abnormal; Notable for the following:    Squamous Epithelial / LPF 0-5 (*)    Bacteria, UA MANY (*)    All other components within normal limits  URINE CULTURE  I-STAT CG4 LACTIC ACID, ED    Imaging Review Dg Chest 2 View  01/22/2015  CLINICAL DATA:  Cough for several weeks three-day history of breast carcinoma EXAM: CHEST  2 VIEW COMPARISON:  August 20, 2014 FINDINGS: There is no edema or consolidation. The heart size and pulmonary vascularity are within limits. There is atherosclerotic calcification in the aorta. There is degenerative change in thoracic spine. There is thoracolumbar levoscoliosis. Patient is status post left mastectomy. Bones are osteoporotic. No adenopathy apparent. IMPRESSION: No edema or consolidation. Status post left mastectomy. There are foci of atherosclerotic calcification. Electronically Signed   By: Lowella Grip III M.D.   On: 01/22/2015 15:28   I have personally reviewed and evaluated these images and lab results as part of my medical decision-making.   EKG Interpretation None      MDM   Final diagnoses:  Acute lower UTI    Patient appears to have a UTI. Her symptoms just started this morning and my suspicion of pyelonephritis is thus low.  She is afebrile. I highly doubt systemic disease but there is an elevated WBC and her initial heart rate was in the 110s. Now it is in the 80s and 90s. She feels unchanged. She feels well and wants to go home. I discussed that the WBC count and initial elevated heart rate are concerning but she does not want to come in to the hospital for IV antibiotics. I feel this is reasonable as her lactate was normal and her kidney function is normal. Unclear exactly why her heart was elevated but given she does not want to be admitted she will be discharged with oral antibiotics and strict return precautions and recommend follow-up with PCP in a couple days.    Sherwood Gambler, MD 01/23/15 0005

## 2015-01-22 NOTE — ED Notes (Signed)
MD at bedside. 

## 2015-01-22 NOTE — ED Notes (Signed)
Pt states burning with urination starting this morning.  Pt placed on antibiotic last week for bronchitis

## 2015-01-23 LAB — URINE CULTURE: Culture: 1000

## 2015-03-12 DIAGNOSIS — H353123 Nonexudative age-related macular degeneration, left eye, advanced atrophic without subfoveal involvement: Secondary | ICD-10-CM | POA: Diagnosis not present

## 2015-03-12 DIAGNOSIS — H43813 Vitreous degeneration, bilateral: Secondary | ICD-10-CM | POA: Diagnosis not present

## 2015-03-12 DIAGNOSIS — H353213 Exudative age-related macular degeneration, right eye, with inactive scar: Secondary | ICD-10-CM | POA: Diagnosis not present

## 2015-03-12 DIAGNOSIS — H35423 Microcystoid degeneration of retina, bilateral: Secondary | ICD-10-CM | POA: Diagnosis not present

## 2015-03-19 DIAGNOSIS — E46 Unspecified protein-calorie malnutrition: Secondary | ICD-10-CM | POA: Diagnosis not present

## 2015-03-19 DIAGNOSIS — J329 Chronic sinusitis, unspecified: Secondary | ICD-10-CM | POA: Diagnosis not present

## 2015-03-19 DIAGNOSIS — K219 Gastro-esophageal reflux disease without esophagitis: Secondary | ICD-10-CM | POA: Diagnosis not present

## 2015-03-19 DIAGNOSIS — J449 Chronic obstructive pulmonary disease, unspecified: Secondary | ICD-10-CM | POA: Diagnosis not present

## 2015-03-19 DIAGNOSIS — H6120 Impacted cerumen, unspecified ear: Secondary | ICD-10-CM | POA: Diagnosis not present

## 2015-04-16 DIAGNOSIS — E0590 Thyrotoxicosis, unspecified without thyrotoxic crisis or storm: Secondary | ICD-10-CM | POA: Diagnosis not present

## 2015-04-18 DIAGNOSIS — J449 Chronic obstructive pulmonary disease, unspecified: Secondary | ICD-10-CM | POA: Diagnosis not present

## 2015-04-18 DIAGNOSIS — E46 Unspecified protein-calorie malnutrition: Secondary | ICD-10-CM | POA: Diagnosis not present

## 2015-04-18 DIAGNOSIS — H9209 Otalgia, unspecified ear: Secondary | ICD-10-CM | POA: Diagnosis not present

## 2015-06-21 ENCOUNTER — Other Ambulatory Visit: Payer: Self-pay | Admitting: Family Medicine

## 2015-06-21 ENCOUNTER — Ambulatory Visit
Admission: RE | Admit: 2015-06-21 | Discharge: 2015-06-21 | Disposition: A | Discharge status: Home / Self Care | Payer: Medicare Other | Source: Ambulatory Visit | Attending: Family Medicine | Admitting: Family Medicine

## 2015-06-21 DIAGNOSIS — I10 Essential (primary) hypertension: Secondary | ICD-10-CM | POA: Diagnosis not present

## 2015-06-21 DIAGNOSIS — J449 Chronic obstructive pulmonary disease, unspecified: Secondary | ICD-10-CM | POA: Diagnosis not present

## 2015-06-21 DIAGNOSIS — R0602 Shortness of breath: Secondary | ICD-10-CM

## 2015-06-21 DIAGNOSIS — F411 Generalized anxiety disorder: Secondary | ICD-10-CM | POA: Diagnosis not present

## 2015-06-21 DIAGNOSIS — E46 Unspecified protein-calorie malnutrition: Secondary | ICD-10-CM | POA: Diagnosis not present

## 2015-06-21 DIAGNOSIS — E041 Nontoxic single thyroid nodule: Secondary | ICD-10-CM | POA: Diagnosis not present

## 2015-07-12 DIAGNOSIS — H353124 Nonexudative age-related macular degeneration, left eye, advanced atrophic with subfoveal involvement: Secondary | ICD-10-CM | POA: Diagnosis not present

## 2015-07-12 DIAGNOSIS — H35423 Microcystoid degeneration of retina, bilateral: Secondary | ICD-10-CM | POA: Diagnosis not present

## 2015-07-12 DIAGNOSIS — H353213 Exudative age-related macular degeneration, right eye, with inactive scar: Secondary | ICD-10-CM | POA: Diagnosis not present

## 2015-07-12 DIAGNOSIS — H43813 Vitreous degeneration, bilateral: Secondary | ICD-10-CM | POA: Diagnosis not present

## 2015-07-29 DIAGNOSIS — H53413 Scotoma involving central area, bilateral: Secondary | ICD-10-CM | POA: Diagnosis not present

## 2015-07-29 DIAGNOSIS — Z853 Personal history of malignant neoplasm of breast: Secondary | ICD-10-CM | POA: Diagnosis not present

## 2015-07-29 DIAGNOSIS — H353134 Nonexudative age-related macular degeneration, bilateral, advanced atrophic with subfoveal involvement: Secondary | ICD-10-CM | POA: Diagnosis not present

## 2015-07-29 DIAGNOSIS — H353212 Exudative age-related macular degeneration, right eye, with inactive choroidal neovascularization: Secondary | ICD-10-CM | POA: Diagnosis not present

## 2015-07-29 DIAGNOSIS — H52223 Regular astigmatism, bilateral: Secondary | ICD-10-CM | POA: Diagnosis not present

## 2015-07-29 DIAGNOSIS — Z8679 Personal history of other diseases of the circulatory system: Secondary | ICD-10-CM | POA: Diagnosis not present

## 2015-07-29 DIAGNOSIS — Z961 Presence of intraocular lens: Secondary | ICD-10-CM | POA: Diagnosis not present

## 2015-07-29 DIAGNOSIS — Z8709 Personal history of other diseases of the respiratory system: Secondary | ICD-10-CM | POA: Diagnosis not present

## 2015-09-10 ENCOUNTER — Encounter (HOSPITAL_BASED_OUTPATIENT_CLINIC_OR_DEPARTMENT_OTHER): Payer: Self-pay | Admitting: Emergency Medicine

## 2015-09-10 ENCOUNTER — Emergency Department (HOSPITAL_BASED_OUTPATIENT_CLINIC_OR_DEPARTMENT_OTHER)
Admission: EM | Admit: 2015-09-10 | Discharge: 2015-09-10 | Disposition: A | Discharge status: Home / Self Care | Payer: Medicare Other | Attending: Emergency Medicine | Admitting: Emergency Medicine

## 2015-09-10 ENCOUNTER — Emergency Department (HOSPITAL_BASED_OUTPATIENT_CLINIC_OR_DEPARTMENT_OTHER): Payer: Medicare Other

## 2015-09-10 DIAGNOSIS — R51 Headache: Secondary | ICD-10-CM | POA: Diagnosis not present

## 2015-09-10 DIAGNOSIS — R0981 Nasal congestion: Secondary | ICD-10-CM | POA: Diagnosis not present

## 2015-09-10 DIAGNOSIS — R05 Cough: Secondary | ICD-10-CM | POA: Diagnosis not present

## 2015-09-10 DIAGNOSIS — H9209 Otalgia, unspecified ear: Secondary | ICD-10-CM | POA: Diagnosis not present

## 2015-09-10 DIAGNOSIS — J3489 Other specified disorders of nose and nasal sinuses: Secondary | ICD-10-CM | POA: Diagnosis not present

## 2015-09-10 DIAGNOSIS — Z87891 Personal history of nicotine dependence: Secondary | ICD-10-CM | POA: Insufficient documentation

## 2015-09-10 DIAGNOSIS — Z7982 Long term (current) use of aspirin: Secondary | ICD-10-CM | POA: Insufficient documentation

## 2015-09-10 DIAGNOSIS — J449 Chronic obstructive pulmonary disease, unspecified: Secondary | ICD-10-CM | POA: Insufficient documentation

## 2015-09-10 DIAGNOSIS — Z853 Personal history of malignant neoplasm of breast: Secondary | ICD-10-CM | POA: Insufficient documentation

## 2015-09-10 DIAGNOSIS — J40 Bronchitis, not specified as acute or chronic: Secondary | ICD-10-CM | POA: Insufficient documentation

## 2015-09-10 DIAGNOSIS — R42 Dizziness and giddiness: Secondary | ICD-10-CM | POA: Diagnosis not present

## 2015-09-10 LAB — CBC WITH DIFFERENTIAL/PLATELET
Basophils Absolute: 0 10*3/uL (ref 0.0–0.1)
Basophils Relative: 0 %
EOS ABS: 0.2 10*3/uL (ref 0.0–0.7)
EOS PCT: 3 %
HCT: 42.2 % (ref 36.0–46.0)
Hemoglobin: 13.9 g/dL (ref 12.0–15.0)
LYMPHS ABS: 1.6 10*3/uL (ref 0.7–4.0)
Lymphocytes Relative: 20 %
MCH: 29.9 pg (ref 26.0–34.0)
MCHC: 32.9 g/dL (ref 30.0–36.0)
MCV: 90.8 fL (ref 78.0–100.0)
MONOS PCT: 7 %
Monocytes Absolute: 0.6 10*3/uL (ref 0.1–1.0)
Neutro Abs: 5.4 10*3/uL (ref 1.7–7.7)
Neutrophils Relative %: 70 %
PLATELETS: 229 10*3/uL (ref 150–400)
RBC: 4.65 MIL/uL (ref 3.87–5.11)
RDW: 15.7 % — ABNORMAL HIGH (ref 11.5–15.5)
WBC: 7.8 10*3/uL (ref 4.0–10.5)

## 2015-09-10 LAB — COMPREHENSIVE METABOLIC PANEL
ALT: 12 U/L — ABNORMAL LOW (ref 14–54)
ANION GAP: 7 (ref 5–15)
AST: 19 U/L (ref 15–41)
Albumin: 3.8 g/dL (ref 3.5–5.0)
Alkaline Phosphatase: 85 U/L (ref 38–126)
BUN: 11 mg/dL (ref 6–20)
CHLORIDE: 103 mmol/L (ref 101–111)
CO2: 28 mmol/L (ref 22–32)
Calcium: 9.3 mg/dL (ref 8.9–10.3)
Creatinine, Ser: 0.73 mg/dL (ref 0.44–1.00)
GFR calc non Af Amer: >60 mL/min (ref >60)
Glucose, Bld: 105 mg/dL — ABNORMAL HIGH (ref 65–99)
POTASSIUM: 3.8 mmol/L (ref 3.5–5.1)
SODIUM: 138 mmol/L (ref 135–145)
Total Bilirubin: 0.7 mg/dL (ref 0.3–1.2)
Total Protein: 6.4 g/dL — ABNORMAL LOW (ref 6.5–8.1)

## 2015-09-10 LAB — TROPONIN I: Troponin I: <0.03 ng/mL (ref <0.03)

## 2015-09-10 MED ORDER — SODIUM CHLORIDE 0.9 % IV BOLUS (SEPSIS)
500.0000 mL | Freq: Once | INTRAVENOUS | Status: AC
  Administered 2015-09-10: 500 mL via INTRAVENOUS

## 2015-09-10 MED ORDER — AZITHROMYCIN 250 MG PO TABS: ORAL_TABLET | ORAL | 0 refills | Status: DC

## 2015-09-10 MED ORDER — ACETAMINOPHEN 325 MG PO TABS
650.0000 mg | ORAL_TABLET | Freq: Once | ORAL | Status: AC
  Administered 2015-09-10: 650 mg via ORAL
  Filled 2015-09-10: qty 2

## 2015-09-10 MED FILL — AZITHROMYCIN 250 MG TABLET: 250 | 5 days supply | Qty: 6 | Fill #0

## 2015-09-10 NOTE — Discharge Instructions (Signed)
Take claritin daily.   Increase flonase to twice daily .  Take zpack as prescribed.   See your doctor.   Return to ER if you have worse congestion, purulent drainage from the nose, worse cough, fevers.

## 2015-09-10 NOTE — ED Triage Notes (Signed)
Pt states she is having sinus pressure over her eyes.  Pt states her ear hurts as well.  Some unsteady gait.  Pt coughed at home and had incontinence.

## 2015-09-10 NOTE — ED Provider Notes (Signed)
[place] [facility] Provider Note   CSN: [id number] Arrival date & time: 09/10/15  [id number]  First Provider Contact:  First MD Initiated Contact with Patient 09/10/15 0740        History   Chief Complaint Chief Complaint  Patient presents with  . Cough  . Otalgia    HPI Monique Guerra is a 82 y.o. female hx of reflux, COPD, pneumonia, breast cancer in remission, Who presenting with sinus congestion, headaches, ear pain, unsteadiness. Patient states that she's been having frontal sinus congestion for the last week or so. Also has some frontal headaches as well. Has tried motrin and zyrtec occasionally with minimal relief. Denies purulent discharge from the nose. Has bilateral ear pain and pressure as well. Has been on flonase once daily with minimal relief. Yesterday, she had an episode of coughing after she eats and an episode of incontinence when she coughs but able to urinate normally afterwards and denies dysuria or abdominal pain. Patient felt light headed today and felt unsteady but didn't fall. Denies trouble speaking or weakness or numbness. Denies previous hx of strokes.   The history is provided by the patient.    Past Medical History:  Diagnosis Date  . Acid reflux   . Anxiety   . Breast cancer ([place])   . COPD (chronic obstructive pulmonary disease) ([place])   . Environmental allergies   . Hyperthyroidism   . Macular degeneration   . Multiple thyroid nodules June 2014  . Pneumonia   . Vocal cord paralysis June 2014    There are no active problems to display for this patient.   Past Surgical History:  Procedure Laterality Date  . ADENOIDECTOMY    . HIP FRACTURE SURGERY    . MASTECTOMY    . TONSILLECTOMY      OB History    No data available       Home Medications    Prior to Admission medications   Medication Sig Start Date End Date Taking? Authorizing Provider  aspirin 81 MG tablet Take 81 mg by mouth daily.    Historical Provider, MD  Calcium  Carbonate-Vitamin D (CALCIUM-D PO) Take by mouth.    Historical Provider, MD  cephALEXin (KEFLEX) 500 MG capsule Take 1 capsule (500 mg total) by mouth 3 (three) times daily. 01/22/15   Sherwood Gambler, MD  Famotidine (PEPCID PO) Take by mouth.    Historical Provider, MD  lisinopril-hydrochlorothiazide (PRINZIDE,ZESTORETIC) 10-12.5 MG per tablet Take 1 tablet by mouth daily.    Historical Provider, MD  Multiple Vitamin (MULTIVITAMIN) tablet Take 1 tablet by mouth daily.    Historical Provider, MD  pravastatin (PRAVACHOL) 40 MG tablet Take 40 mg by mouth daily.    Historical Provider, MD  sulfamethoxazole-trimethoprim (SEPTRA DS) 800-160 MG per tablet Take 1 tablet by mouth every 12 (twelve) hours. 02/02/12   Malvin Johns, MD    Family History No family history on file.  Social History Social History  Substance Use Topics  . Smoking status: Former Smoker    Quit date: 02/03/1988  . Smokeless tobacco: Never Used  . Alcohol use No     Allergies   Other and Aminophylline   Review of Systems Review of Systems  HENT: Positive for ear pain.   Respiratory: Positive for cough.   Neurological: Positive for dizziness.  All other systems reviewed and are negative.    Physical Exam Updated Vital Signs BP 142/90 Comment: standing  Pulse 93 Comment: standing  Temp 97.7 F (36.5  C)   Resp 19 Comment: standing  Ht 5\' 2"  (1.575 m)   Wt 120 lb (54.4 kg)   SpO2 93% Comment: standing  BMI 21.95 kg/m   Physical Exam  Constitutional: She is oriented to person, place, and time.  Well appearing for age   HENT:  Head: Normocephalic.  Mild bilateral frontal sinus tenderness. No purulent discharge from the nose. Mild bilateral TM effusions, but TM but red. OP clear   Eyes: EOM are normal. Pupils are equal, round, and reactive to light.  Neck: Normal range of motion. Neck supple.  No cervical LAD. No stridor.   Cardiovascular: Normal rate, regular rhythm and normal heart sounds.     Pulmonary/Chest: Effort normal and breath sounds normal. No respiratory distress. She has no wheezes.  Abdominal: Soft. Bowel sounds are normal.  Musculoskeletal: Normal range of motion.  Neurological: She is alert and oriented to person, place, and time.  CN 2-12 intact. Nl strength throughout. Nl gait.   Skin: Skin is warm.  Psychiatric: She has a normal mood and affect.  Nursing note and vitals reviewed.    ED Treatments / Results  Labs (all labs ordered are listed, but only abnormal results are displayed) Labs Reviewed  CBC WITH DIFFERENTIAL/PLATELET - Abnormal; Notable for the following:       Result Value   RDW 15.7 (*)    All other components within normal limits  COMPREHENSIVE METABOLIC PANEL - Abnormal; Notable for the following:    Glucose, Bld 105 (*)    Total Protein 6.4 (*)    ALT 12 (*)    All other components within normal limits  TROPONIN I    EKG  EKG Interpretation  Date/Time:  Tuesday September 10 2015 08:01:21 EDT Ventricular Rate:  94 PR Interval:    QRS Duration: 84 QT Interval:  365 QTC Calculation: 457 R Axis:   -74 Text Interpretation:  Sinus rhythm Left anterior fascicular block Anterior infarct, old No significant change since last tracing Confirmed by Wayde Gopaul  MD, Kaylena Pacifico ([id number]) on 09/10/2015 8:08:40 AM       Radiology Dg Neck Soft Tissue  Result Date: 09/10/2015 CLINICAL DATA:  82 year old female with a history of sinus pressure EXAM: NECK SOFT TISSUES - 1+ VIEW COMPARISON:  Head CT 09/10/2015, chest x-ray 06/21/2015 FINDINGS: There is no evidence of retropharyngeal soft tissue swelling or epiglottic enlargement. The cervical airway is unremarkable and no radio-opaque foreign body identified. Aortic atherosclerosis.  Intracranial atherosclerosis. Degenerative changes of the cervical spine. IMPRESSION: Unremarkable appearance of the head and neck soft tissues. Signed, Dulcy Fanny. Earleen Newport, DO [facility]  Radiology Electronically Signed   By: Corrie Mckusick D.O.   On: 09/10/2015 08:33   Dg Chest 2 View  Result Date: 09/10/2015 CLINICAL DATA:  Cough, sinus pressure EXAM: CHEST  2 VIEW COMPARISON:  06/21/2015 FINDINGS: Cardiomediastinal silhouette is stable. No acute infiltrate or pleural effusion. No pulmonary edema. Again noted hyperinflation and chronic mild interstitial prominence. Degenerative changes bilateral shoulders. Osteopenia and mild degenerative changes thoracic spine. IMPRESSION: No active disease.  Stable COPD. Electronically Signed   By: Lahoma Crocker M.D.   On: 09/10/2015 08:33   Ct Head Wo Contrast  Result Date: 09/10/2015 CLINICAL DATA:  82 year old female with history of sinus pressure over her eyes. History of COPD and breast cancer status post mastectomy. EXAM: CT HEAD WITHOUT CONTRAST TECHNIQUE: Contiguous axial images were obtained from the base of the skull through the vertex without intravenous contrast.  COMPARISON:  No priors. FINDINGS: Patchy and confluent areas of decreased attenuation are noted throughout the deep and periventricular white matter of the cerebral hemispheres bilaterally, compatible with chronic microvascular ischemic disease. No acute intracranial abnormalities. Specifically, no evidence of acute intracranial hemorrhage, no definite findings of acute/subacute cerebral ischemia, no mass, mass effect, hydrocephalus or abnormal intra or extra-axial fluid collections. Visualized paranasal sinuses and mastoids are well pneumatized. No acute displaced skull fractures are identified. IMPRESSION: 1. No acute intracranial abnormalities. 2. Extensive chronic microvascular ischemic changes in the cerebral white matter, as above. Electronically Signed   By: Vinnie Langton M.D.   On: 09/10/2015 08:36    Procedures Procedures (including critical care time)  Medications Ordered in ED Medications  sodium chloride 0.9 % bolus 500 mL (500 mLs Intravenous New Bag/Given 09/10/15  0841)  acetaminophen (TYLENOL) tablet 650 mg (650 mg Oral Given 09/10/15 0831)     Initial Impression / Assessment and Plan / ED Course  I have reviewed the triage vital signs and the nursing notes.  Pertinent labs & imaging results that were available during my care of the patient were reviewed by me and considered in my medical decision making (see chart for details).  Clinical Course   PERRIN PETROWSKI is a 82 y.o. female here with sinus pressure, bilateral TM bulging but no evidence of otitis. Likely seasonal allergies causing congestion and eustachian tube dysfunction. Low suspicion for stroke. Will get labs, orthostatics, CT head. Will reassess.   8:51 AM  Patient not orthostatic. Felt better with tylenol, fluids. Passed swallow screen. Xrays showed stable COPD. Labs at baseline. I think likely seasonal allergies. Refused steroids given side effects in the past. Recommend claritin, increase flonase to BID. She states that her sputum is more yellow and requests abx. Since CXR showed no pneumonia and CT showed no obvious sinusitis, I hesitate to give her a strong abx so will give zpack for possible bronchitis.    Final Clinical Impressions(s) / ED Diagnoses   Final diagnoses:  None    New Prescriptions New Prescriptions   No medications on file     Drenda Freeze, MD 09/10/15 [id number]

## 2015-09-11 ENCOUNTER — Emergency Department (HOSPITAL_BASED_OUTPATIENT_CLINIC_OR_DEPARTMENT_OTHER)
Admission: EM | Admit: 2015-09-11 | Discharge: 2015-09-11 | Disposition: A | Discharge status: Home / Self Care | Payer: Medicare Other | Attending: Emergency Medicine | Admitting: Emergency Medicine

## 2015-09-11 ENCOUNTER — Encounter (HOSPITAL_BASED_OUTPATIENT_CLINIC_OR_DEPARTMENT_OTHER): Payer: Self-pay

## 2015-09-11 DIAGNOSIS — R059 Cough, unspecified: Secondary | ICD-10-CM

## 2015-09-11 DIAGNOSIS — R05 Cough: Secondary | ICD-10-CM | POA: Diagnosis not present

## 2015-09-11 DIAGNOSIS — Z87891 Personal history of nicotine dependence: Secondary | ICD-10-CM | POA: Insufficient documentation

## 2015-09-11 DIAGNOSIS — Z853 Personal history of malignant neoplasm of breast: Secondary | ICD-10-CM | POA: Insufficient documentation

## 2015-09-11 DIAGNOSIS — Z7982 Long term (current) use of aspirin: Secondary | ICD-10-CM | POA: Diagnosis not present

## 2015-09-11 DIAGNOSIS — J449 Chronic obstructive pulmonary disease, unspecified: Secondary | ICD-10-CM | POA: Diagnosis not present

## 2015-09-11 MED ORDER — IPRATROPIUM-ALBUTEROL 0.5-2.5 (3) MG/3ML IN SOLN
3.0000 mL | RESPIRATORY_TRACT | Status: DC
  Administered 2015-09-11: 3 mL via RESPIRATORY_TRACT
  Filled 2015-09-11: qty 3

## 2015-09-11 MED ORDER — ALBUTEROL SULFATE HFA 108 (90 BASE) MCG/ACT IN AERS
2.0000 | INHALATION_SPRAY | Freq: Once | RESPIRATORY_TRACT | Status: AC
  Administered 2015-09-11: 2 via RESPIRATORY_TRACT
  Filled 2015-09-11: qty 6.7

## 2015-09-11 NOTE — ED Provider Notes (Signed)
[facility] DEPT [facility] Provider Note   CSN: [id number] Arrival date & time: 09/11/15  2059  First Provider Contact:   First MD Initiated Contact with Patient 09/11/15 2134      By signing my name below, I, Soijett Singac, attest that this documentation has been prepared under the direction and in the presence of Charlesetta Shanks, MD. Electronically Signed: Soijett Blue, ED Scribe. 09/11/15. 9:48 PM.   History   Chief Complaint Chief Complaint  Patient presents with  . Cough    HPI Monique Guerra is a 82 y.o. female with a PMHx of COPD, environmental allergies, breast CA, who presents to the Emergency Department complaining of intermittent cough onset 2 weeks worsening 2 days ago. Pt notes that she is unsure if her symptoms are due to reflux or sinuses. Pt reports that she was seen in the ED yesterday for evaluation of her symptoms. Pt states that she has an albuterol inhaler that she doesn't like to use. Pt relative denies the pt having a nebulizer machine. She notes that she is having associated symptoms of sinus pressure and rhinorrhea. She states that she has tried Rx zithromax and pepcid for the relief for her symptoms. She denies fever, CP, SOB, leg swelling, and any other symptoms.   Per pt chart review: Pt was seen in the ED on 09/10/2015 for cough and bilateral ear pain. Pt had CXR imaging completed with "IMPRESSION: No active disease. Stable COPD." results. Pt was dx with bronchitis and Rx zithromax, claritin, and increase flonase use, for the relief of her symptoms.   The history is provided by the patient and a relative. No language interpreter was used.     Past Medical History:  Diagnosis Date  . Acid reflux   . Anxiety   . Breast cancer ([place])   . COPD (chronic obstructive pulmonary disease) ([place])   . Environmental allergies   . Hyperthyroidism   . Macular degeneration   . Multiple thyroid nodules June 2014  . Pneumonia   . Vocal cord paralysis June 2014    There  are no active problems to display for this patient.   Past Surgical History:  Procedure Laterality Date  . ADENOIDECTOMY    . HIP FRACTURE SURGERY    . MASTECTOMY    . TONSILLECTOMY      OB History    No data available       Home Medications    Prior to Admission medications   Medication Sig Start Date End Date Taking? Authorizing Provider  aspirin 81 MG tablet Take 81 mg by mouth daily.    Historical Provider, MD  azithromycin (ZITHROMAX Z-PAK) 250 MG tablet Use as directed 09/10/15   Drenda Freeze, MD  Calcium Carbonate-Vitamin D (CALCIUM-D PO) Take by mouth.    Historical Provider, MD  Famotidine (PEPCID PO) Take by mouth.    Historical Provider, MD  lisinopril-hydrochlorothiazide (PRINZIDE,ZESTORETIC) 10-12.5 MG per tablet Take 1 tablet by mouth daily.    Historical Provider, MD  Multiple Vitamin (MULTIVITAMIN) tablet Take 1 tablet by mouth daily.    Historical Provider, MD  pravastatin (PRAVACHOL) 40 MG tablet Take 40 mg by mouth daily.    Historical Provider, MD    Family History No family history on file.  Social History Social History  Substance Use Topics  . Smoking status: Former Smoker    Quit date: 02/03/1988  . Smokeless tobacco: Never Used  . Alcohol use No     Allergies   Other  and Aminophylline   Review of Systems Review of Systems  Constitutional: Negative for fever.  HENT: Positive for rhinorrhea and sinus pressure.   Respiratory: Positive for cough. Negative for shortness of breath.   Cardiovascular: Negative for chest pain and leg swelling.     Physical Exam Updated Vital Signs BP 145/84   Pulse 96   Temp 98.1 F (36.7 C) (Oral)   Resp 20   Ht 5\' 1"  (1.549 m)   Wt 120 lb (54.4 kg)   SpO2 93%   BMI 22.67 kg/m   Physical Exam  Constitutional: She is oriented to person, place, and time. She appears well-developed and well-nourished. No distress.  HENT:  Head: Normocephalic and atraumatic.  Mouth/Throat: Uvula is midline,  oropharynx is clear and moist and mucous membranes are normal.  Eyes: EOM are normal.  Neck: Neck supple.  Cardiovascular: Normal rate, regular rhythm and normal heart sounds.  Exam reveals no gallop and no friction rub.   No murmur heard. 1/6 systolic ejection murmur  Pulmonary/Chest: Effort normal. No respiratory distress. She has wheezes. She has no rales.  Soft breathe sounds. Occasional expiratory wheezes.   Abdominal: She exhibits no distension.  Musculoskeletal: Normal range of motion. She exhibits no edema.  BLE with no peripheral edema. Bilateral calves soft.   Neurological: She is alert and oriented to person, place, and time.  Skin: Skin is warm and dry.  Psychiatric: She has a normal mood and affect. Her behavior is normal.  Nursing note and vitals reviewed.    ED Treatments / Results  DIAGNOSTIC STUDIES: Oxygen Saturation is 93% on RA, low by my interpretation.    COORDINATION OF CARE: 9:47 PM Discussed treatment plan with pt at bedside which includes breathing treatment and pt agreed to plan.   Labs (all labs ordered are listed, but only abnormal results are displayed) Labs Reviewed - No data to display  EKG  EKG Interpretation None       Radiology Dg Neck Soft Tissue  Result Date: 09/10/2015 CLINICAL DATA:  82 year old female with a history of sinus pressure EXAM: NECK SOFT TISSUES - 1+ VIEW COMPARISON:  Head CT 09/10/2015, chest x-ray 06/21/2015 FINDINGS: There is no evidence of retropharyngeal soft tissue swelling or epiglottic enlargement. The cervical airway is unremarkable and no radio-opaque foreign body identified. Aortic atherosclerosis.  Intracranial atherosclerosis. Degenerative changes of the cervical spine. IMPRESSION: Unremarkable appearance of the head and neck soft tissues. Signed, Dulcy Fanny. Earleen Newport, DO [facility] Electronically Signed   By: Corrie Mckusick D.O.   On: 09/10/2015 08:33   Dg  Chest 2 View  Result Date: 09/10/2015 CLINICAL DATA:  Cough, sinus pressure EXAM: CHEST  2 VIEW COMPARISON:  06/21/2015 FINDINGS: Cardiomediastinal silhouette is stable. No acute infiltrate or pleural effusion. No pulmonary edema. Again noted hyperinflation and chronic mild interstitial prominence. Degenerative changes bilateral shoulders. Osteopenia and mild degenerative changes thoracic spine. IMPRESSION: No active disease.  Stable COPD. Electronically Signed   By: Lahoma Crocker M.D.   On: 09/10/2015 08:33   Ct Head Wo Contrast  Result Date: 09/10/2015 CLINICAL DATA:  82 year old female with history of sinus pressure over her eyes. History of COPD and breast cancer status post mastectomy. EXAM: CT HEAD WITHOUT CONTRAST TECHNIQUE: Contiguous axial images were obtained from the base of the skull through the vertex without intravenous contrast. COMPARISON:  No priors. FINDINGS: Patchy and confluent areas of decreased attenuation are noted throughout the deep and periventricular white matter of  the cerebral hemispheres bilaterally, compatible with chronic microvascular ischemic disease. No acute intracranial abnormalities. Specifically, no evidence of acute intracranial hemorrhage, no definite findings of acute/subacute cerebral ischemia, no mass, mass effect, hydrocephalus or abnormal intra or extra-axial fluid collections. Visualized paranasal sinuses and mastoids are well pneumatized. No acute displaced skull fractures are identified. IMPRESSION: 1. No acute intracranial abnormalities. 2. Extensive chronic microvascular ischemic changes in the cerebral white matter, as above. Electronically Signed   By: Vinnie Langton M.D.   On: 09/10/2015 08:36    Procedures Procedures (including critical care time)  Medications Ordered in ED Medications  ipratropium-albuterol (DUONEB) 0.5-2.5 (3) MG/3ML nebulizer solution 3 mL (3 mLs Nebulization Given 09/11/15 2221)  albuterol (PROVENTIL HFA;VENTOLIN HFA) 108 (90 Base)  MCG/ACT inhaler 2 puff (2 puffs Inhalation Given 09/11/15 2245)     Initial Impression / Assessment and Plan / ED Course  I have reviewed the triage vital signs and the nursing notes.  Pertinent labs & imaging results that were available during my care of the patient were reviewed by me and considered in my medical decision making (see chart for details).  Clinical Course      Final Clinical Impressions(s) / ED Diagnoses   Final diagnoses:  Cough  Chronic obstructive pulmonary disease, unspecified COPD type ([place])  Patient has clinically well appearance. She shows no signs of respiratory distress. She has rare cough while in the emergency department. Mental status is very clear. Patient extensive diagnostic workup yesterday. At this time, I do not feel that additional diagnostic testing is indicated. Patient was given a Z-Pak to take. She reports she is still having cough. She reports she cannot take any narcotic cough medications. She is given an albuterol MDI to use as needed.  New Prescriptions New Prescriptions   No medications on file       Charlesetta Shanks, MD 09/11/15 2304

## 2015-09-11 NOTE — ED Triage Notes (Signed)
Pt states she is here due to she took cough meds and can not stop coughing-NAD-no coughing at present-seen here yesterday dx with bronchitis

## 2015-10-04 DIAGNOSIS — Z23 Encounter for immunization: Secondary | ICD-10-CM | POA: Diagnosis not present

## 2015-10-17 DIAGNOSIS — E0590 Thyrotoxicosis, unspecified without thyrotoxic crisis or storm: Secondary | ICD-10-CM | POA: Diagnosis not present

## 2015-11-14 DIAGNOSIS — E0590 Thyrotoxicosis, unspecified without thyrotoxic crisis or storm: Secondary | ICD-10-CM | POA: Diagnosis not present

## 2015-11-15 DIAGNOSIS — H5372 Impaired contrast sensitivity: Secondary | ICD-10-CM | POA: Diagnosis not present

## 2015-11-15 DIAGNOSIS — H353134 Nonexudative age-related macular degeneration, bilateral, advanced atrophic with subfoveal involvement: Secondary | ICD-10-CM | POA: Diagnosis not present

## 2015-11-15 DIAGNOSIS — H53413 Scotoma involving central area, bilateral: Secondary | ICD-10-CM | POA: Diagnosis not present

## 2015-11-20 DIAGNOSIS — H53413 Scotoma involving central area, bilateral: Secondary | ICD-10-CM | POA: Diagnosis not present

## 2015-11-20 DIAGNOSIS — H353134 Nonexudative age-related macular degeneration, bilateral, advanced atrophic with subfoveal involvement: Secondary | ICD-10-CM | POA: Diagnosis not present

## 2015-11-20 DIAGNOSIS — H5372 Impaired contrast sensitivity: Secondary | ICD-10-CM | POA: Diagnosis not present

## 2015-11-29 DIAGNOSIS — H52223 Regular astigmatism, bilateral: Secondary | ICD-10-CM | POA: Diagnosis not present

## 2015-11-29 DIAGNOSIS — J449 Chronic obstructive pulmonary disease, unspecified: Secondary | ICD-10-CM | POA: Diagnosis not present

## 2015-11-29 DIAGNOSIS — Z961 Presence of intraocular lens: Secondary | ICD-10-CM | POA: Diagnosis not present

## 2015-11-29 DIAGNOSIS — Z85118 Personal history of other malignant neoplasm of bronchus and lung: Secondary | ICD-10-CM | POA: Diagnosis not present

## 2015-11-29 DIAGNOSIS — H353134 Nonexudative age-related macular degeneration, bilateral, advanced atrophic with subfoveal involvement: Secondary | ICD-10-CM | POA: Diagnosis not present

## 2015-11-29 DIAGNOSIS — H353212 Exudative age-related macular degeneration, right eye, with inactive choroidal neovascularization: Secondary | ICD-10-CM | POA: Diagnosis not present

## 2015-11-29 DIAGNOSIS — H53413 Scotoma involving central area, bilateral: Secondary | ICD-10-CM | POA: Diagnosis not present

## 2015-11-29 DIAGNOSIS — I10 Essential (primary) hypertension: Secondary | ICD-10-CM | POA: Diagnosis not present

## 2016-01-23 DIAGNOSIS — Z853 Personal history of malignant neoplasm of breast: Secondary | ICD-10-CM | POA: Diagnosis not present

## 2016-01-23 DIAGNOSIS — R0600 Dyspnea, unspecified: Secondary | ICD-10-CM | POA: Diagnosis not present

## 2016-01-23 DIAGNOSIS — J441 Chronic obstructive pulmonary disease with (acute) exacerbation: Secondary | ICD-10-CM | POA: Diagnosis not present

## 2016-01-23 DIAGNOSIS — J9611 Chronic respiratory failure with hypoxia: Secondary | ICD-10-CM | POA: Diagnosis not present

## 2016-01-23 DIAGNOSIS — E0590 Thyrotoxicosis, unspecified without thyrotoxic crisis or storm: Secondary | ICD-10-CM | POA: Diagnosis not present

## 2016-01-23 DIAGNOSIS — R918 Other nonspecific abnormal finding of lung field: Secondary | ICD-10-CM | POA: Diagnosis not present

## 2016-01-23 DIAGNOSIS — D72829 Elevated white blood cell count, unspecified: Secondary | ICD-10-CM | POA: Diagnosis not present

## 2016-01-23 DIAGNOSIS — E871 Hypo-osmolality and hyponatremia: Secondary | ICD-10-CM | POA: Diagnosis not present

## 2016-01-23 DIAGNOSIS — I10 Essential (primary) hypertension: Secondary | ICD-10-CM | POA: Diagnosis not present

## 2016-01-23 DIAGNOSIS — J209 Acute bronchitis, unspecified: Secondary | ICD-10-CM | POA: Diagnosis not present

## 2016-01-23 DIAGNOSIS — F419 Anxiety disorder, unspecified: Secondary | ICD-10-CM | POA: Diagnosis present

## 2016-01-23 DIAGNOSIS — J440 Chronic obstructive pulmonary disease with acute lower respiratory infection: Secondary | ICD-10-CM | POA: Diagnosis not present

## 2016-01-23 DIAGNOSIS — Z87891 Personal history of nicotine dependence: Secondary | ICD-10-CM | POA: Diagnosis not present

## 2016-01-23 DIAGNOSIS — Z780 Asymptomatic menopausal state: Secondary | ICD-10-CM | POA: Diagnosis not present

## 2016-01-23 DIAGNOSIS — Z7982 Long term (current) use of aspirin: Secondary | ICD-10-CM | POA: Diagnosis not present

## 2016-01-23 DIAGNOSIS — Z79899 Other long term (current) drug therapy: Secondary | ICD-10-CM | POA: Diagnosis not present

## 2016-01-26 DIAGNOSIS — J440 Chronic obstructive pulmonary disease with acute lower respiratory infection: Secondary | ICD-10-CM | POA: Diagnosis not present

## 2016-01-26 DIAGNOSIS — I10 Essential (primary) hypertension: Secondary | ICD-10-CM | POA: Diagnosis not present

## 2016-01-26 DIAGNOSIS — J441 Chronic obstructive pulmonary disease with (acute) exacerbation: Secondary | ICD-10-CM | POA: Diagnosis not present

## 2016-01-26 DIAGNOSIS — Z87891 Personal history of nicotine dependence: Secondary | ICD-10-CM | POA: Diagnosis not present

## 2016-01-26 DIAGNOSIS — Z853 Personal history of malignant neoplasm of breast: Secondary | ICD-10-CM | POA: Diagnosis not present

## 2016-01-26 DIAGNOSIS — J9611 Chronic respiratory failure with hypoxia: Secondary | ICD-10-CM | POA: Diagnosis not present

## 2016-01-26 DIAGNOSIS — Z9981 Dependence on supplemental oxygen: Secondary | ICD-10-CM | POA: Diagnosis not present

## 2016-01-26 DIAGNOSIS — Z7982 Long term (current) use of aspirin: Secondary | ICD-10-CM | POA: Diagnosis not present

## 2016-01-26 DIAGNOSIS — J209 Acute bronchitis, unspecified: Secondary | ICD-10-CM | POA: Diagnosis not present

## 2016-01-29 DIAGNOSIS — J9611 Chronic respiratory failure with hypoxia: Secondary | ICD-10-CM | POA: Diagnosis not present

## 2016-01-29 DIAGNOSIS — Z9981 Dependence on supplemental oxygen: Secondary | ICD-10-CM | POA: Diagnosis not present

## 2016-01-29 DIAGNOSIS — J441 Chronic obstructive pulmonary disease with (acute) exacerbation: Secondary | ICD-10-CM | POA: Diagnosis not present

## 2016-01-29 DIAGNOSIS — J209 Acute bronchitis, unspecified: Secondary | ICD-10-CM | POA: Diagnosis not present

## 2016-01-29 DIAGNOSIS — J440 Chronic obstructive pulmonary disease with acute lower respiratory infection: Secondary | ICD-10-CM | POA: Diagnosis not present

## 2016-01-29 DIAGNOSIS — I10 Essential (primary) hypertension: Secondary | ICD-10-CM | POA: Diagnosis not present

## 2016-01-30 DIAGNOSIS — F411 Generalized anxiety disorder: Secondary | ICD-10-CM | POA: Diagnosis not present

## 2016-01-30 DIAGNOSIS — J9691 Respiratory failure, unspecified with hypoxia: Secondary | ICD-10-CM | POA: Diagnosis not present

## 2016-01-30 DIAGNOSIS — J449 Chronic obstructive pulmonary disease, unspecified: Secondary | ICD-10-CM | POA: Diagnosis not present

## 2016-01-30 DIAGNOSIS — J441 Chronic obstructive pulmonary disease with (acute) exacerbation: Secondary | ICD-10-CM | POA: Diagnosis not present

## 2016-02-01 DIAGNOSIS — J9611 Chronic respiratory failure with hypoxia: Secondary | ICD-10-CM | POA: Diagnosis not present

## 2016-02-01 DIAGNOSIS — J441 Chronic obstructive pulmonary disease with (acute) exacerbation: Secondary | ICD-10-CM | POA: Diagnosis not present

## 2016-02-01 DIAGNOSIS — Z9981 Dependence on supplemental oxygen: Secondary | ICD-10-CM | POA: Diagnosis not present

## 2016-02-01 DIAGNOSIS — J440 Chronic obstructive pulmonary disease with acute lower respiratory infection: Secondary | ICD-10-CM | POA: Diagnosis not present

## 2016-02-01 DIAGNOSIS — J209 Acute bronchitis, unspecified: Secondary | ICD-10-CM | POA: Diagnosis not present

## 2016-02-01 DIAGNOSIS — I10 Essential (primary) hypertension: Secondary | ICD-10-CM | POA: Diagnosis not present

## 2016-02-04 DIAGNOSIS — J441 Chronic obstructive pulmonary disease with (acute) exacerbation: Secondary | ICD-10-CM | POA: Diagnosis not present

## 2016-02-04 DIAGNOSIS — J9611 Chronic respiratory failure with hypoxia: Secondary | ICD-10-CM | POA: Diagnosis not present

## 2016-02-04 DIAGNOSIS — I10 Essential (primary) hypertension: Secondary | ICD-10-CM | POA: Diagnosis not present

## 2016-02-04 DIAGNOSIS — Z9981 Dependence on supplemental oxygen: Secondary | ICD-10-CM | POA: Diagnosis not present

## 2016-02-04 DIAGNOSIS — J209 Acute bronchitis, unspecified: Secondary | ICD-10-CM | POA: Diagnosis not present

## 2016-02-04 DIAGNOSIS — J440 Chronic obstructive pulmonary disease with acute lower respiratory infection: Secondary | ICD-10-CM | POA: Diagnosis not present

## 2016-02-06 DIAGNOSIS — J441 Chronic obstructive pulmonary disease with (acute) exacerbation: Secondary | ICD-10-CM | POA: Diagnosis not present

## 2016-02-06 DIAGNOSIS — J209 Acute bronchitis, unspecified: Secondary | ICD-10-CM | POA: Diagnosis not present

## 2016-02-06 DIAGNOSIS — J440 Chronic obstructive pulmonary disease with acute lower respiratory infection: Secondary | ICD-10-CM | POA: Diagnosis not present

## 2016-02-06 DIAGNOSIS — J9611 Chronic respiratory failure with hypoxia: Secondary | ICD-10-CM | POA: Diagnosis not present

## 2016-02-06 DIAGNOSIS — I10 Essential (primary) hypertension: Secondary | ICD-10-CM | POA: Diagnosis not present

## 2016-02-06 DIAGNOSIS — Z9981 Dependence on supplemental oxygen: Secondary | ICD-10-CM | POA: Diagnosis not present

## 2016-02-12 DIAGNOSIS — I700 Atherosclerosis of aorta: Secondary | ICD-10-CM | POA: Diagnosis not present

## 2016-02-12 DIAGNOSIS — E0590 Thyrotoxicosis, unspecified without thyrotoxic crisis or storm: Secondary | ICD-10-CM | POA: Diagnosis not present

## 2016-02-12 DIAGNOSIS — F411 Generalized anxiety disorder: Secondary | ICD-10-CM | POA: Diagnosis not present

## 2016-02-12 DIAGNOSIS — J9691 Respiratory failure, unspecified with hypoxia: Secondary | ICD-10-CM | POA: Diagnosis not present

## 2016-02-12 DIAGNOSIS — E46 Unspecified protein-calorie malnutrition: Secondary | ICD-10-CM | POA: Diagnosis not present

## 2016-02-12 DIAGNOSIS — J449 Chronic obstructive pulmonary disease, unspecified: Secondary | ICD-10-CM | POA: Diagnosis not present

## 2016-02-12 DIAGNOSIS — Z0000 Encounter for general adult medical examination without abnormal findings: Secondary | ICD-10-CM | POA: Diagnosis not present

## 2016-02-12 DIAGNOSIS — I10 Essential (primary) hypertension: Secondary | ICD-10-CM | POA: Diagnosis not present

## 2016-02-13 DIAGNOSIS — Z9981 Dependence on supplemental oxygen: Secondary | ICD-10-CM | POA: Diagnosis not present

## 2016-02-13 DIAGNOSIS — J441 Chronic obstructive pulmonary disease with (acute) exacerbation: Secondary | ICD-10-CM | POA: Diagnosis not present

## 2016-02-13 DIAGNOSIS — I10 Essential (primary) hypertension: Secondary | ICD-10-CM | POA: Diagnosis not present

## 2016-02-13 DIAGNOSIS — J440 Chronic obstructive pulmonary disease with acute lower respiratory infection: Secondary | ICD-10-CM | POA: Diagnosis not present

## 2016-02-13 DIAGNOSIS — J9611 Chronic respiratory failure with hypoxia: Secondary | ICD-10-CM | POA: Diagnosis not present

## 2016-02-13 DIAGNOSIS — J209 Acute bronchitis, unspecified: Secondary | ICD-10-CM | POA: Diagnosis not present

## 2016-02-18 DIAGNOSIS — J209 Acute bronchitis, unspecified: Secondary | ICD-10-CM | POA: Diagnosis not present

## 2016-02-18 DIAGNOSIS — J441 Chronic obstructive pulmonary disease with (acute) exacerbation: Secondary | ICD-10-CM | POA: Diagnosis not present

## 2016-02-18 DIAGNOSIS — Z9981 Dependence on supplemental oxygen: Secondary | ICD-10-CM | POA: Diagnosis not present

## 2016-02-18 DIAGNOSIS — J9611 Chronic respiratory failure with hypoxia: Secondary | ICD-10-CM | POA: Diagnosis not present

## 2016-02-18 DIAGNOSIS — I10 Essential (primary) hypertension: Secondary | ICD-10-CM | POA: Diagnosis not present

## 2016-02-18 DIAGNOSIS — J440 Chronic obstructive pulmonary disease with acute lower respiratory infection: Secondary | ICD-10-CM | POA: Diagnosis not present

## 2016-02-26 DIAGNOSIS — J209 Acute bronchitis, unspecified: Secondary | ICD-10-CM | POA: Diagnosis not present

## 2016-02-26 DIAGNOSIS — I10 Essential (primary) hypertension: Secondary | ICD-10-CM | POA: Diagnosis not present

## 2016-02-26 DIAGNOSIS — J440 Chronic obstructive pulmonary disease with acute lower respiratory infection: Secondary | ICD-10-CM | POA: Diagnosis not present

## 2016-02-26 DIAGNOSIS — Z9981 Dependence on supplemental oxygen: Secondary | ICD-10-CM | POA: Diagnosis not present

## 2016-02-26 DIAGNOSIS — R0602 Shortness of breath: Secondary | ICD-10-CM | POA: Diagnosis not present

## 2016-02-26 DIAGNOSIS — J441 Chronic obstructive pulmonary disease with (acute) exacerbation: Secondary | ICD-10-CM | POA: Diagnosis not present

## 2016-02-26 DIAGNOSIS — J9611 Chronic respiratory failure with hypoxia: Secondary | ICD-10-CM | POA: Diagnosis not present

## 2016-02-28 ENCOUNTER — Encounter (HOSPITAL_BASED_OUTPATIENT_CLINIC_OR_DEPARTMENT_OTHER): Payer: Self-pay | Admitting: *Deleted

## 2016-02-28 ENCOUNTER — Inpatient Hospital Stay (HOSPITAL_BASED_OUTPATIENT_CLINIC_OR_DEPARTMENT_OTHER)
Admission: EM | Admit: 2016-02-28 | Discharge: 2016-03-04 | DRG: 177 | Disposition: A | Discharge status: Skilled Nursing Facility | Payer: Medicare Other | Attending: Nephrology | Admitting: Nephrology

## 2016-02-28 ENCOUNTER — Emergency Department (HOSPITAL_BASED_OUTPATIENT_CLINIC_OR_DEPARTMENT_OTHER): Payer: Medicare Other

## 2016-02-28 DIAGNOSIS — E039 Hypothyroidism, unspecified: Secondary | ICD-10-CM | POA: Diagnosis present

## 2016-02-28 DIAGNOSIS — E876 Hypokalemia: Secondary | ICD-10-CM | POA: Diagnosis not present

## 2016-02-28 DIAGNOSIS — R531 Weakness: Secondary | ICD-10-CM | POA: Diagnosis not present

## 2016-02-28 DIAGNOSIS — I639 Cerebral infarction, unspecified: Secondary | ICD-10-CM

## 2016-02-28 DIAGNOSIS — R930 Abnormal findings on diagnostic imaging of skull and head, not elsewhere classified: Secondary | ICD-10-CM | POA: Diagnosis not present

## 2016-02-28 DIAGNOSIS — I10 Essential (primary) hypertension: Secondary | ICD-10-CM | POA: Diagnosis present

## 2016-02-28 DIAGNOSIS — J3800 Paralysis of vocal cords and larynx, unspecified: Secondary | ICD-10-CM | POA: Diagnosis present

## 2016-02-28 DIAGNOSIS — Z79899 Other long term (current) drug therapy: Secondary | ICD-10-CM

## 2016-02-28 DIAGNOSIS — Z66 Do not resuscitate: Secondary | ICD-10-CM | POA: Diagnosis not present

## 2016-02-28 DIAGNOSIS — R32 Unspecified urinary incontinence: Secondary | ICD-10-CM | POA: Diagnosis present

## 2016-02-28 DIAGNOSIS — Z515 Encounter for palliative care: Secondary | ICD-10-CM

## 2016-02-28 DIAGNOSIS — Z853 Personal history of malignant neoplasm of breast: Secondary | ICD-10-CM

## 2016-02-28 DIAGNOSIS — J9601 Acute respiratory failure with hypoxia: Secondary | ICD-10-CM | POA: Diagnosis not present

## 2016-02-28 DIAGNOSIS — J441 Chronic obstructive pulmonary disease with (acute) exacerbation: Secondary | ICD-10-CM | POA: Diagnosis not present

## 2016-02-28 DIAGNOSIS — E0590 Thyrotoxicosis, unspecified without thyrotoxic crisis or storm: Secondary | ICD-10-CM | POA: Diagnosis present

## 2016-02-28 DIAGNOSIS — Z87891 Personal history of nicotine dependence: Secondary | ICD-10-CM

## 2016-02-28 DIAGNOSIS — R1310 Dysphagia, unspecified: Secondary | ICD-10-CM | POA: Diagnosis not present

## 2016-02-28 DIAGNOSIS — K219 Gastro-esophageal reflux disease without esophagitis: Secondary | ICD-10-CM | POA: Diagnosis present

## 2016-02-28 DIAGNOSIS — J690 Pneumonitis due to inhalation of food and vomit: Principal | ICD-10-CM | POA: Diagnosis present

## 2016-02-28 DIAGNOSIS — Z9012 Acquired absence of left breast and nipple: Secondary | ICD-10-CM

## 2016-02-28 DIAGNOSIS — H3530 Unspecified macular degeneration: Secondary | ICD-10-CM | POA: Diagnosis present

## 2016-02-28 DIAGNOSIS — J181 Lobar pneumonia, unspecified organism: Secondary | ICD-10-CM

## 2016-02-28 DIAGNOSIS — J189 Pneumonia, unspecified organism: Secondary | ICD-10-CM

## 2016-02-28 DIAGNOSIS — Z7982 Long term (current) use of aspirin: Secondary | ICD-10-CM

## 2016-02-28 LAB — URINALYSIS, ROUTINE W REFLEX MICROSCOPIC
BILIRUBIN URINE: NEGATIVE
GLUCOSE, UA: NEGATIVE mg/dL
Hgb urine dipstick: NEGATIVE
KETONES UR: NEGATIVE mg/dL
Leukocytes, UA: NEGATIVE
NITRITE: NEGATIVE
PH: 6.5 (ref 5.0–8.0)
Protein, ur: NEGATIVE mg/dL
SPECIFIC GRAVITY, URINE: 1.015 (ref 1.005–1.030)

## 2016-02-28 LAB — BRAIN NATRIURETIC PEPTIDE: B NATRIURETIC PEPTIDE 5: 37.1 pg/mL (ref 0.0–100.0)

## 2016-02-28 LAB — CBC WITH DIFFERENTIAL/PLATELET
Basophils Absolute: 0 10*3/uL (ref 0.0–0.1)
Basophils Relative: 0 %
EOS ABS: 0.5 10*3/uL (ref 0.0–0.7)
Eosinophils Relative: 5 %
HCT: 38.5 % (ref 36.0–46.0)
HEMOGLOBIN: 12.7 g/dL (ref 12.0–15.0)
LYMPHS ABS: 1.2 10*3/uL (ref 0.7–4.0)
LYMPHS PCT: 11 %
MCH: 30 pg (ref 26.0–34.0)
MCHC: 33 g/dL (ref 30.0–36.0)
MCV: 90.8 fL (ref 78.0–100.0)
MONOS PCT: 9 %
Monocytes Absolute: 0.9 10*3/uL (ref 0.1–1.0)
Neutro Abs: 8.1 10*3/uL — ABNORMAL HIGH (ref 1.7–7.7)
Neutrophils Relative %: 75 %
Platelets: 288 10*3/uL (ref 150–400)
RBC: 4.24 MIL/uL (ref 3.87–5.11)
RDW: 15.6 % — ABNORMAL HIGH (ref 11.5–15.5)
WBC: 10.7 10*3/uL — ABNORMAL HIGH (ref 4.0–10.5)

## 2016-02-28 LAB — COMPREHENSIVE METABOLIC PANEL
ALK PHOS: 91 U/L (ref 38–126)
ALT: 14 U/L (ref 14–54)
ANION GAP: 9 (ref 5–15)
AST: 17 U/L (ref 15–41)
Albumin: 3.2 g/dL — ABNORMAL LOW (ref 3.5–5.0)
BILIRUBIN TOTAL: 0.4 mg/dL (ref 0.3–1.2)
BUN: 16 mg/dL (ref 6–20)
CALCIUM: 9.1 mg/dL (ref 8.9–10.3)
CO2: 30 mmol/L (ref 22–32)
CREATININE: 0.62 mg/dL (ref 0.44–1.00)
Chloride: 101 mmol/L (ref 101–111)
Glucose, Bld: 116 mg/dL — ABNORMAL HIGH (ref 65–99)
Potassium: 3.2 mmol/L — ABNORMAL LOW (ref 3.5–5.1)
SODIUM: 140 mmol/L (ref 135–145)
TOTAL PROTEIN: 5.9 g/dL — AB (ref 6.5–8.1)

## 2016-02-28 LAB — PROTIME-INR
INR: 0.98
PROTHROMBIN TIME: 13 s (ref 11.4–15.2)

## 2016-02-28 LAB — TROPONIN I

## 2016-02-28 LAB — I-STAT CG4 LACTIC ACID, ED: LACTIC ACID, VENOUS: 0.82 mmol/L (ref 0.5–1.9)

## 2016-02-28 LAB — APTT: aPTT: 28 seconds (ref 24–36)

## 2016-02-28 MED ORDER — PIPERACILLIN-TAZOBACTAM 3.375 G IVPB 30 MIN
3.3750 g | Freq: Once | INTRAVENOUS | Status: AC
  Administered 2016-02-28: 3.375 g via INTRAVENOUS
  Filled 2016-02-28: qty 50

## 2016-02-28 MED ORDER — VANCOMYCIN HCL IN DEXTROSE 1-5 GM/200ML-% IV SOLN
1000.0000 mg | Freq: Once | INTRAVENOUS | Status: AC
  Administered 2016-02-28: 1000 mg via INTRAVENOUS
  Filled 2016-02-28: qty 200

## 2016-02-28 MED ORDER — METHYLPREDNISOLONE SODIUM SUCC 125 MG IJ SOLR
125.0000 mg | Freq: Once | INTRAMUSCULAR | Status: AC
Start: 2016-02-28 — End: 2016-02-28
  Administered 2016-02-28: 125 mg via INTRAVENOUS
  Filled 2016-02-28: qty 2

## 2016-02-28 MED ORDER — PIPERACILLIN-TAZOBACTAM 3.375 G IVPB 30 MIN
3.3750 g | Freq: Once | INTRAVENOUS | Status: DC
  Filled 2016-02-28: qty 50

## 2016-02-28 MED ORDER — PIPERACILLIN-TAZOBACTAM 3.375 G IVPB
3.3750 g | Freq: Once | INTRAVENOUS | Status: DC
  Filled 2016-02-28: qty 50

## 2016-02-28 MED ORDER — ALBUTEROL SULFATE (2.5 MG/3ML) 0.083% IN NEBU
5.0000 mg | INHALATION_SOLUTION | Freq: Once | RESPIRATORY_TRACT | Status: AC
  Administered 2016-02-28: 5 mg via RESPIRATORY_TRACT
  Filled 2016-02-28: qty 6

## 2016-02-28 MED ORDER — SODIUM CHLORIDE 0.9 % IN NEBU
INHALATION_SOLUTION | RESPIRATORY_TRACT | Status: AC
  Filled 2016-02-28: qty 3

## 2016-02-28 MED ORDER — SODIUM CHLORIDE 0.9 % IV BOLUS (SEPSIS)
500.0000 mL | Freq: Once | INTRAVENOUS | Status: AC
  Administered 2016-02-28: 500 mL via INTRAVENOUS

## 2016-02-28 NOTE — ED Provider Notes (Addendum)
[facility] DEPT [facility] Provider Note   CSN: [id number] Arrival date & time: 02/28/16  1900  By signing my name below, I, Jaquelyn Bitter., attest that this documentation has been prepared under the direction and in the presence of Julianne Rice, MD. Electronically signed: Jaquelyn Bitter., ED Scribe. 02/29/16. 12:14 AM.    History   Chief Complaint Chief Complaint  Patient presents with  . Dysphagia    HPI  Monique Guerra is a 83 y.o. female with hx of COPD, breast cancer who presents to the Emergency Department complaining of worsening moderate to severe dysphagia with gradual onset x3 weeks. Per son, pt was hospitalized just before Christmas and diagnosed with Bronchitis. Since the hospitalization, son states that pt's condition has gradually worsened and reports that pt has difficulty getting around and handling secretions. He states that she must hold her head to the L side in order to swallow and has been unable to eat. Son also states that pt has required more oxygen recently when before she did not. Pt reports SOB, L hand numbness, weakness, abdominal pain x1 month. Of note, pt is seen by Dr. Serita Grammes with [facility] physicians.   The history is provided by the patient. No language interpreter was used.    Past Medical History:  Diagnosis Date  . Acid reflux   . Anxiety   . Breast cancer ([place])   . COPD (chronic obstructive pulmonary disease) ([place])   . Environmental allergies   . Hyperthyroidism   . Macular degeneration   . Multiple thyroid nodules June 2014  . Pneumonia   . Vocal cord paralysis June 2014    There are no active problems to display for this patient.   Past Surgical History:  Procedure Laterality Date  . ADENOIDECTOMY    . HIP FRACTURE SURGERY    . MASTECTOMY    . TONSILLECTOMY      OB History    No data available       Home Medications    Prior to Admission medications   Medication Sig Start Date End Date  Taking? Authorizing Provider  ALBUTEROL IN Inhale into the lungs.   Yes Historical Provider, MD  aspirin 81 MG tablet Take 81 mg by mouth daily.   Yes Historical Provider, MD  Calcium Carbonate-Vitamin D (CALCIUM-D PO) Take by mouth.   Yes Historical Provider, MD  Famotidine (PEPCID PO) Take by mouth.   Yes Historical Provider, MD  lisinopril-hydrochlorothiazide (PRINZIDE,ZESTORETIC) 10-12.5 MG per tablet Take 1 tablet by mouth daily.   Yes Historical Provider, MD  Multiple Vitamin (MULTIVITAMIN) tablet Take 1 tablet by mouth daily.   Yes Historical Provider, MD  azithromycin (ZITHROMAX Z-PAK) 250 MG tablet Use as directed 09/10/15   Drenda Freeze, MD  pravastatin (PRAVACHOL) 40 MG tablet Take 40 mg by mouth daily.    Historical Provider, MD    Family History No family history on file.  Social History Social History  Substance Use Topics  . Smoking status: Former Smoker    Quit date: 02/03/1988  . Smokeless tobacco: Never Used  . Alcohol use No     Allergies   Other and Aminophylline   Review of Systems Review of Systems  Constitutional: Positive for activity change and appetite change. Negative for chills and fever.  HENT: Positive for trouble swallowing.   Respiratory: Positive for shortness of breath.   Gastrointestinal: Positive for abdominal pain. Negative for nausea and vomiting.  Musculoskeletal: Positive for gait problem.  Negative for back pain and neck pain.  Skin: Negative for rash and wound.  Neurological: Positive for weakness. Negative for dizziness, numbness and headaches.  All other systems reviewed and are negative.    Physical Exam Updated Vital Signs BP 129/70   Pulse 106   Temp 98.2 F (36.8 C) (Oral)   Resp 25   Ht 5\' 1"  (1.549 m)   Wt 115 lb (52.2 kg)   SpO2 91%   BMI 21.73 kg/m   Physical Exam  Constitutional: She is oriented to person, place, and time. She appears well-developed and well-nourished. No distress.  HENT:  Head:  Normocephalic and atraumatic.  Mouth/Throat: Oropharynx is clear and moist.  Eyes: EOM are normal. Pupils are equal, round, and reactive to light.  Neck: Normal range of motion. Neck supple.  No meningismus  Cardiovascular: Normal rate and regular rhythm.  Exam reveals no gallop and no friction rub.   No murmur heard. Pulmonary/Chest: Effort normal and breath sounds normal.  Scattered rhonchi  Abdominal: Soft. Bowel sounds are normal. There is no tenderness. There is no rebound and no guarding.  Musculoskeletal: Normal range of motion. She exhibits no edema or tenderness.  Neurological: She is alert and oriented to person, place, and time.  4/5 motor in all extremities. Sensation grossly intact.  Skin: Skin is warm and dry. Capillary refill takes less than 2 seconds. No rash noted. No erythema.  Psychiatric: She has a normal mood and affect. Her behavior is normal.  Nursing note and vitals reviewed.    ED Treatments / Results   DIAGNOSTIC STUDIES: Oxygen Saturation is 95% on 3l via nasal cannula , inadequate by my interpretation.   COORDINATION OF CARE: 12:14 AM-Discussed next steps with pt. Pt verbalized understanding and is agreeable with the plan.    Labs (all labs ordered are listed, but only abnormal results are displayed) Labs Reviewed  CBC WITH DIFFERENTIAL/PLATELET - Abnormal; Notable for the following:       Result Value   WBC 10.7 (*)    RDW 15.6 (*)    Neutro Abs 8.1 (*)    All other components within normal limits  COMPREHENSIVE METABOLIC PANEL - Abnormal; Notable for the following:    Potassium 3.2 (*)    Glucose, Bld 116 (*)    Total Protein 5.9 (*)    Albumin 3.2 (*)    All other components within normal limits  CULTURE, BLOOD (ROUTINE X 2)  CULTURE, BLOOD (ROUTINE X 2)  BRAIN NATRIURETIC PEPTIDE  TROPONIN I  PROTIME-INR  URINALYSIS, ROUTINE W REFLEX MICROSCOPIC  APTT  I-STAT CG4 LACTIC ACID, ED    EKG  EKG Interpretation  Date/Time:  Friday  February 28 2016 21:38:18 EST Ventricular Rate:  99 PR Interval:    QRS Duration: 83 QT Interval:  358 QTC Calculation: 460 R Axis:   -60 Text Interpretation:  Sinus rhythm Left atrial enlargement Left anterior fascicular block Abnormal R-wave progression, early transition Probable left ventricular hypertrophy Anterior Q waves, possibly due to LVH Confirmed by Lita Mains  MD, Manu Rubey ([id number]) on 02/29/2016 12:16:08 AM       Radiology Dg Neck Soft Tissue  Result Date: 02/28/2016 CLINICAL DATA:  83 year old female with difficulty swallowing and dysphagia. EXAM: NECK SOFT TISSUES - 1+ VIEW COMPARISON:  Neck radiograph dated 09/10/2015 FINDINGS: There is no evidence of retropharyngeal soft tissue swelling or epiglottic enlargement. The cervical airway is unremarkable and no radio-opaque foreign body identified. Stable calcification noted in the region of the larynx.  There is osteopenia with advanced degenerative changes of the spine. Grade 1 C3-C4 anterolisthesis. IMPRESSION: Unremarkable soft tissue radiograph of the neck. Electronically Signed   By: Anner Crete M.D.   On: 02/28/2016 21:51   Dg Chest 2 View  Result Date: 02/28/2016 CLINICAL DATA:  83 year old female with dysphagia. EXAM: CHEST  2 VIEW COMPARISON:  Chest radiograph dated 01/23/2016 FINDINGS: Two views of the chest demonstrate consolidative changes along the right minor fissure involving the right middle lobe. There is silhouetting of the right cardiac border on the frontal view. There is emphysematous changes of the lungs. No pleural effusion or pneumothorax. The cardiac silhouette is within normal limits. There is osteopenia with degenerative changes of the spine and shoulders. No acute osseous pathology. IMPRESSION: Right middle lobe atelectasis versus pneumonia. Clinical correlation and follow-up to resolution is recommended. Electronically Signed   By: Anner Crete M.D.   On: 02/28/2016 21:47   Ct Head Wo Contrast  Result  Date: 02/28/2016 CLINICAL DATA:  83 year old female with difficulty swallowing and dysphagia. EXAM: CT HEAD WITHOUT CONTRAST TECHNIQUE: Contiguous axial images were obtained from the base of the skull through the vertex without intravenous contrast. COMPARISON:  Head CT dated 09/10/2015 FINDINGS: Brain: There is moderate age-related atrophy and chronic microvascular ischemic changes. There is no acute intracranial hemorrhage. No mass effect or midline shift noted. No extra-axial fluid collection. Vascular: No hyperdense vessel or unexpected calcification. Skull: Normal. Negative for fracture or focal lesion. Sinuses/Orbits: No acute finding. Other: None IMPRESSION: No acute intracranial hemorrhage. Age-related atrophy and chronic microvascular ischemic disease. Electronically Signed   By: Anner Crete M.D.   On: 02/28/2016 21:55    Procedures Procedures (including critical care time)  Medications Ordered in ED Medications  vancomycin (VANCOCIN) IVPB 1000 mg/200 mL premix (1,000 mg Intravenous New Bag/Given 02/28/16 2345)  sodium chloride 0.9 % nebulizer solution (not administered)  sodium chloride 0.9 % bolus 500 mL (0 mLs Intravenous Stopped 02/28/16 2256)  albuterol (PROVENTIL) (2.5 MG/3ML) 0.083% nebulizer solution 5 mg (5 mg Nebulization Given 02/28/16 2253)  methylPREDNISolone sodium succinate (SOLU-MEDROL) 125 mg/2 mL injection 125 mg (125 mg Intravenous Given 02/28/16 2242)  piperacillin-tazobactam (ZOSYN) IVPB 3.375 g (0 g Intravenous Stopped 02/28/16 2320)     Initial Impression / Assessment and Plan / ED Course  I have reviewed the triage vital signs and the nursing notes.  Pertinent labs & imaging results that were available during my care of the patient were reviewed by me and considered in my medical decision making (see chart for details).     Discussed with Dr. Eulas Post. Will accept the patient in transfer to [facility] telemetry bed. Spoke with Dr. Nicole Kindred who will consult on the  patient. CT head without any acute findings. Infiltrate in the right lung field. Questionable pneumonia. Patient does have elevation of white blood cell count. Initiated broad-spectrum antibiotics. Concern for possible aspiration.  Final Clinical Impressions(s) / ED Diagnoses   Final diagnoses:  Healthcare-associated pneumonia  Weakness  Dysphagia, unspecified type    New Prescriptions New Prescriptions   No medications on file   I personally performed the services described in this documentation, which was scribed in my presence. The recorded information has been reviewed and is accurate.       Julianne Rice, MD 02/29/16 [id number]    Julianne Rice, MD 03/11/16 [phone]

## 2016-02-28 NOTE — Progress Notes (Signed)
Placed patient on 3 liter nasal cannula.  Patient's SPO2 increased and remains at 95%.

## 2016-02-28 NOTE — ED Notes (Signed)
Patient transported to CT 

## 2016-02-28 NOTE — ED Triage Notes (Signed)
Difficulty swallowing over the past 3 weeks. Gradually getting worse. She is not taking in as much oral intake as she normally does. She lives alone. Family has been staying with her since a bout of bronchitis a month ago. She is alert.

## 2016-02-29 ENCOUNTER — Inpatient Hospital Stay (HOSPITAL_COMMUNITY): Payer: Medicare Other

## 2016-02-29 DIAGNOSIS — Z87891 Personal history of nicotine dependence: Secondary | ICD-10-CM | POA: Diagnosis not present

## 2016-02-29 DIAGNOSIS — J989 Respiratory disorder, unspecified: Secondary | ICD-10-CM | POA: Diagnosis not present

## 2016-02-29 DIAGNOSIS — R531 Weakness: Secondary | ICD-10-CM

## 2016-02-29 DIAGNOSIS — E876 Hypokalemia: Secondary | ICD-10-CM | POA: Diagnosis present

## 2016-02-29 DIAGNOSIS — J9601 Acute respiratory failure with hypoxia: Secondary | ICD-10-CM

## 2016-02-29 DIAGNOSIS — R32 Unspecified urinary incontinence: Secondary | ICD-10-CM | POA: Diagnosis present

## 2016-02-29 DIAGNOSIS — J189 Pneumonia, unspecified organism: Secondary | ICD-10-CM

## 2016-02-29 DIAGNOSIS — Z7982 Long term (current) use of aspirin: Secondary | ICD-10-CM | POA: Diagnosis not present

## 2016-02-29 DIAGNOSIS — Z79899 Other long term (current) drug therapy: Secondary | ICD-10-CM | POA: Diagnosis not present

## 2016-02-29 DIAGNOSIS — Z9012 Acquired absence of left breast and nipple: Secondary | ICD-10-CM | POA: Diagnosis not present

## 2016-02-29 DIAGNOSIS — J3800 Paralysis of vocal cords and larynx, unspecified: Secondary | ICD-10-CM | POA: Diagnosis present

## 2016-02-29 DIAGNOSIS — E0590 Thyrotoxicosis, unspecified without thyrotoxic crisis or storm: Secondary | ICD-10-CM | POA: Diagnosis present

## 2016-02-29 DIAGNOSIS — R1319 Other dysphagia: Secondary | ICD-10-CM

## 2016-02-29 DIAGNOSIS — Z66 Do not resuscitate: Secondary | ICD-10-CM | POA: Diagnosis not present

## 2016-02-29 DIAGNOSIS — I10 Essential (primary) hypertension: Secondary | ICD-10-CM | POA: Diagnosis present

## 2016-02-29 DIAGNOSIS — J181 Lobar pneumonia, unspecified organism: Secondary | ICD-10-CM

## 2016-02-29 DIAGNOSIS — Z853 Personal history of malignant neoplasm of breast: Secondary | ICD-10-CM | POA: Diagnosis not present

## 2016-02-29 DIAGNOSIS — J441 Chronic obstructive pulmonary disease with (acute) exacerbation: Secondary | ICD-10-CM | POA: Diagnosis present

## 2016-02-29 DIAGNOSIS — J690 Pneumonitis due to inhalation of food and vomit: Secondary | ICD-10-CM | POA: Diagnosis present

## 2016-02-29 DIAGNOSIS — H3530 Unspecified macular degeneration: Secondary | ICD-10-CM | POA: Diagnosis present

## 2016-02-29 DIAGNOSIS — K219 Gastro-esophageal reflux disease without esophagitis: Secondary | ICD-10-CM | POA: Diagnosis present

## 2016-02-29 DIAGNOSIS — R1310 Dysphagia, unspecified: Secondary | ICD-10-CM | POA: Diagnosis not present

## 2016-02-29 DIAGNOSIS — Z515 Encounter for palliative care: Secondary | ICD-10-CM | POA: Diagnosis not present

## 2016-02-29 DIAGNOSIS — E039 Hypothyroidism, unspecified: Secondary | ICD-10-CM | POA: Diagnosis present

## 2016-02-29 LAB — HIV ANTIBODY (ROUTINE TESTING W REFLEX): HIV SCREEN 4TH GENERATION: NONREACTIVE

## 2016-02-29 MED ORDER — METHIMAZOLE 5 MG PO TABS
5.0000 mg | ORAL_TABLET | ORAL | Status: DC
  Administered 2016-03-03: 5 mg via ORAL
  Filled 2016-02-29 (×3): qty 1

## 2016-02-29 MED ORDER — CITALOPRAM HYDROBROMIDE 20 MG PO TABS
10.0000 mg | ORAL_TABLET | Freq: Every day | ORAL | Status: DC
  Administered 2016-02-29 – 2016-03-03 (×3): 10 mg via ORAL
  Filled 2016-02-29 (×4): qty 1

## 2016-02-29 MED ORDER — ASPIRIN EC 81 MG PO TBEC
81.0000 mg | DELAYED_RELEASE_TABLET | Freq: Every day | ORAL | Status: DC
  Administered 2016-02-29 – 2016-03-03 (×3): 81 mg via ORAL
  Filled 2016-02-29 (×4): qty 1

## 2016-02-29 MED ORDER — PIPERACILLIN-TAZOBACTAM 3.375 G IVPB
3.3750 g | Freq: Three times a day (TID) | INTRAVENOUS | Status: DC
  Administered 2016-02-29 – 2016-03-03 (×11): 3.375 g via INTRAVENOUS
  Filled 2016-02-29 (×11): qty 50

## 2016-02-29 MED ORDER — ASPIRIN 81 MG PO CHEW: 81.0000 mg | CHEWABLE_TABLET | Freq: Every day | ORAL | Status: DC

## 2016-02-29 MED ORDER — PIPERACILLIN-TAZOBACTAM 3.375 G IVPB
3.3750 g | Freq: Three times a day (TID) | INTRAVENOUS | Status: DC
  Filled 2016-02-29: qty 50

## 2016-02-29 MED ORDER — BUDESONIDE 0.5 MG/2ML IN SUSP
2.0000 mg | Freq: Every day | RESPIRATORY_TRACT | Status: DC
  Administered 2016-03-01: 0.25 mg via RESPIRATORY_TRACT
  Filled 2016-02-29 (×3): qty 8

## 2016-02-29 MED ORDER — PREDNISONE 20 MG PO TABS
40.0000 mg | ORAL_TABLET | Freq: Every day | ORAL | Status: DC
  Administered 2016-03-01 – 2016-03-03 (×2): 40 mg via ORAL
  Filled 2016-02-29 (×3): qty 2

## 2016-02-29 MED ORDER — ALBUTEROL SULFATE (2.5 MG/3ML) 0.083% IN NEBU
2.5000 mg | INHALATION_SOLUTION | RESPIRATORY_TRACT | Status: DC | PRN
  Administered 2016-02-29: 2.5 mg via RESPIRATORY_TRACT
  Filled 2016-02-29: qty 3

## 2016-02-29 MED ORDER — PIPERACILLIN-TAZOBACTAM 3.375 G IVPB
3.3750 g | Freq: Three times a day (TID) | INTRAVENOUS | Status: DC
  Filled 2016-02-29 (×2): qty 50

## 2016-02-29 MED ORDER — LISINOPRIL-HYDROCHLOROTHIAZIDE 10-12.5 MG PO TABS: 1.0000 | ORAL_TABLET | Freq: Every day | ORAL | Status: DC

## 2016-02-29 MED ORDER — LISINOPRIL 10 MG PO TABS: 10.0000 mg | ORAL_TABLET | Freq: Every day | ORAL | Status: DC

## 2016-02-29 MED ORDER — SODIUM CHLORIDE 0.9 % IV SOLN
INTRAVENOUS | Status: DC
  Administered 2016-02-29: 16:00:00 via INTRAVENOUS

## 2016-02-29 MED ORDER — RISAQUAD PO CAPS
2.0000 | ORAL_CAPSULE | Freq: Every day | ORAL | Status: DC
  Administered 2016-02-29 – 2016-03-03 (×3): 2 via ORAL
  Filled 2016-02-29 (×4): qty 2

## 2016-02-29 MED ORDER — HYDROCHLOROTHIAZIDE 12.5 MG PO CAPS: 12.5000 mg | ORAL_CAPSULE | Freq: Every day | ORAL | Status: DC

## 2016-02-29 MED ORDER — IPRATROPIUM-ALBUTEROL 0.5-2.5 (3) MG/3ML IN SOLN
3.0000 mL | Freq: Three times a day (TID) | RESPIRATORY_TRACT | Status: DC
  Administered 2016-02-29 – 2016-03-01 (×5): 3 mL via RESPIRATORY_TRACT
  Filled 2016-02-29 (×5): qty 3

## 2016-02-29 MED ORDER — VANCOMYCIN HCL IN DEXTROSE 750-5 MG/150ML-% IV SOLN: 750.0000 mg | INTRAVENOUS | Status: DC

## 2016-02-29 MED ORDER — ADULT MULTIVITAMIN W/MINERALS CH
1.0000 | ORAL_TABLET | Freq: Every day | ORAL | Status: DC
  Administered 2016-03-01 – 2016-03-03 (×2): 1 via ORAL
  Filled 2016-02-29 (×3): qty 1

## 2016-02-29 MED ORDER — FAMOTIDINE 20 MG PO TABS
20.0000 mg | ORAL_TABLET | Freq: Every evening | ORAL | Status: DC | PRN
Start: 2016-02-29 — End: 2016-03-03

## 2016-02-29 MED ORDER — IPRATROPIUM-ALBUTEROL 0.5-2.5 (3) MG/3ML IN SOLN: 3.0000 mL | Freq: Three times a day (TID) | RESPIRATORY_TRACT | Status: DC | PRN

## 2016-02-29 MED ORDER — METHIMAZOLE 5 MG PO TABS
2.5000 mg | ORAL_TABLET | ORAL | Status: DC
  Administered 2016-02-29 – 2016-03-01 (×2): 2.5 mg via ORAL
  Filled 2016-02-29 (×2): qty 1

## 2016-02-29 MED ORDER — DEXTROSE 5 % IV SOLN
1.0000 g | INTRAVENOUS | Status: DC
  Administered 2016-02-29: 1 g via INTRAVENOUS
  Filled 2016-02-29: qty 1

## 2016-02-29 MED ORDER — ENOXAPARIN SODIUM 40 MG/0.4ML ~~LOC~~ SOLN
40.0000 mg | SUBCUTANEOUS | Status: DC
  Administered 2016-02-29 – 2016-03-03 (×3): 40 mg via SUBCUTANEOUS
  Filled 2016-02-29 (×3): qty 0.4

## 2016-02-29 MED ORDER — ADULT MULTIVITAMIN W/MINERALS CH: 1.0000 | ORAL_TABLET | Freq: Every day | ORAL | Status: DC

## 2016-02-29 MED ORDER — PRAVASTATIN SODIUM 40 MG PO TABS
40.0000 mg | ORAL_TABLET | Freq: Every day | ORAL | Status: DC
  Administered 2016-02-29 – 2016-03-02 (×3): 40 mg via ORAL
  Filled 2016-02-29 (×3): qty 1

## 2016-02-29 MED ORDER — GUAIFENESIN ER 600 MG PO TB12
600.0000 mg | ORAL_TABLET | Freq: Two times a day (BID) | ORAL | Status: DC | PRN
Start: 2016-02-29 — End: 2016-03-03
  Administered 2016-03-03: 600 mg via ORAL
  Filled 2016-02-29: qty 1

## 2016-02-29 NOTE — Progress Notes (Addendum)
Initial Nutrition Assessment  DOCUMENTATION CODES:  Not applicable  INTERVENTION:  Unable to provide interventions at this time, Monitor for diet advancement and order appropriate supplements as warranted.   NUTRITION DIAGNOSIS:  Inadequate oral intake related to dysphagia as evidenced by per patient/family report.  GOAL:  Patient will meet greater than or equal to 90% of their needs  MONITOR:  PO intake, Supplement acceptance, Labs, Diet advancement, I & O's  REASON FOR ASSESSMENT:  Consult COPD Protocol  ASSESSMENT:  83 y/o female with PMHx COPD, BRCA in distant past, vocal cord paralysis. Presented to the ED with moderate-severe and worsening dysphagia over the last 3 weeks. Had recent hospitalization for COPD exacerbation. CXR suspicious for PNA   Entered as SLP was exiting. She reported the patient turned down a swallow eval at this time, but communicated that pt had a esophageal dilation planned prior to being admitted and now GI has been consulted.   Pt has history of vocal cord paralysis which makes speaking tiring and slightly painful. As such, did not speak much with patient.   The pt did state that she turns her head to the left when eating due to having water in her ear?? Did not seem to relate this behavior to coping with her dysphagia, though per son, this is the only what she can eat.   Unable to determine how much the patient has been eating these past few weeks.   Most of Pt's wt measurement appear to be carried encounter to encounter and are therefore unreliable, however, further wt history able to be obtained via "care everywhere". Was 119.3 lbs on 12/22 after her hospital admission for COPD. Appears to have lost~4 lbs (3%) since then. This is not clinically significant for time frame.   Labs: WBC: 10.7,  Medications: Hydrochlorothiazide, Zosyn, MVI, Vanc, Prednisone   Recent Labs Lab 02/28/16 2154  NA 140  K 3.2*  CL 101  CO2 30  BUN 16  CREATININE  0.62  CALCIUM 9.1  GLUCOSE 116*   Diet Order:  Diet NPO time specified  Skin:  Reviewed, no issues  Last BM:  1/25  Height:  Ht Readings from Last 1 Encounters:  02/28/16 '5\' 1"'$  (1.549 m)   Weight:  Wt Readings from Last 1 Encounters:  02/28/16 115 lb (52.2 kg)   Wt Readings from Last 10 Encounters:  02/28/16 115 lb (52.2 kg)  09/11/15 120 lb (54.4 kg)  09/10/15 120 lb (54.4 kg)  01/22/15 120 lb (54.4 kg)  08/20/14 120 lb (54.4 kg)  12/05/13 120 lb (54.4 kg)   Ideal Body Weight:  47.73 kg  BMI:  Body mass index is 21.73 kg/m.  Estimated Nutritional Needs:  Kcal:  1400-1600 (27-31 kcal/kg bw) Protein:  68-78 g (1.3-1.5 g/kg bw) Fluid:  >1.3 L (25 ml/kg bw)  EDUCATION NEEDS:  No education needs identified at this time  Burtis Junes RD, LDN, [facility] Pager: [phone] 02/29/2016 11:38 AM

## 2016-02-29 NOTE — Progress Notes (Signed)
Pharmacy Antibiotic Note  Monique Guerra is a 83 y.o. female admitted on 02/28/2016 with HCAP. Noted pt with recent admission to [facility] for bronchitis. Pharmacy has been consulted for Vancomycin and Cefepime dosing x 8 days.  Pt received Zosyn 3.375gm ~2200 and Vanc 1gm ~2345  Plan: Change Cefepime to 1gm IV q24h x 8 days Vancomycin 750mg  IV q24h x 8 days Will f/u micro data, renal function, and pt's clinical condition Vanc trough prn   Height: 5\' 1"  (154.9 cm) Weight: 115 lb (52.2 kg) IBW/kg (Calculated) : 47.8  Temp (24hrs), Avg:98.2 F (36.8 C), Min:98.2 F (36.8 C), Max:98.2 F (36.8 C)   Recent Labs Lab 02/28/16 2154 02/28/16 2246  WBC 10.7*  --   CREATININE 0.62  --   LATICACIDVEN  --  0.82    Estimated Creatinine Clearance: 33.9 mL/min (by C-G formula based on SCr of 0.62 mg/dL).    Allergies  Allergen Reactions  . Other     Cough med-tachycardia  . Aminophylline Palpitations    Antimicrobials this admission: 1/26 Zosyn x 1 1/26 Vanc >>  1/26 Cefepime >>   Dose adjustments this admission: n/a  Microbiology results: 1/26 BCx x2:   Thank you for allowing pharmacy to be a part of this patient's care.  Sherlon Handing, PharmD, BCPS Clinical pharmacist, pager [phone] 02/29/2016 2:55 AM

## 2016-02-29 NOTE — Progress Notes (Signed)
Pt arrived on the unit alert and oriented x 4 via bed.  Vital sign stable. No complaints of pain.  IV to the right forearm. Skin assessment complete with nurse kaelin R.  No skin issues.  Educated the patient on how to reach the nurse with the call light.  Lowered the bed. Placed the call light within reach. Will continue to monitor the patient

## 2016-02-29 NOTE — Progress Notes (Addendum)
Pharmacy Antibiotic Note  Monique Guerra is a 83 y.o. female admitted on 02/28/2016 with HCAP. Noted pt with recent admission to [facility] for bronchitis. Patient was started on vancomycin and zosyn in the ED and was originally consulted to start vanc and cefepime, but decision has now been made to continue vanc and zosyn instead due to concern for aspiration. -Pt received Zosyn 3.375gm ~2200 and Vanc 1gm ~2345 and cefepime ~0730  Plan: -Zosyn 3.375g q8h -Vancomycin 750mg  IV q24h x 8 days -Will f/u micro data, renal function, and pt's clinical condition -Vanc trough prn  Height: 5\' 1"  (154.9 cm) Weight: 115 lb (52.2 kg) IBW/kg (Calculated) : 47.8  Temp (24hrs), Avg:98.1 F (36.7 C), Min:97.9 F (36.6 C), Max:98.2 F (36.8 C)   Recent Labs Lab 02/28/16 2154 02/28/16 2246  WBC 10.7*  --   CREATININE 0.62  --   LATICACIDVEN  --  0.82    Estimated Creatinine Clearance: 33.9 mL/min (by C-G formula based on SCr of 0.62 mg/dL).    Allergies  Allergen Reactions  . Other     Cough med-tachycardia  . Aminophylline Palpitations    Antimicrobials this admission: 1/26 Zosyn >> 1/26 Vanc >>  1/27 Cefepime x1  Dose adjustments this admission: n/a  Microbiology results: 1/26 BCx x2: sent Sputum: sent  Thank you for allowing pharmacy to be a part of this patient's care.  Myer Peer Grayland Ormond), PharmD  [id number] Pharmacy Resident Pager: [phone] 02/29/2016 7:40 AM

## 2016-02-29 NOTE — Evaluation (Signed)
Clinical/Bedside Swallow Evaluation Patient Details  Name: Monique Guerra MRN: [id number] Date of Birth: January 24, 1922  Today's Date: 02/29/2016 Time: SLP Start Time (ACUTE ONLY): 1315 SLP Stop Time (ACUTE ONLY): 1345 SLP Time Calculation (min) (ACUTE ONLY): 30 min  Past Medical History:  Past Medical History:  Diagnosis Date  . Acid reflux   . Anxiety   . Breast cancer ([place])   . COPD (chronic obstructive pulmonary disease) ([place])   . Environmental allergies   . Hyperthyroidism   . Macular degeneration   . Multiple thyroid nodules June 2014  . Pneumonia   . Vocal cord paralysis June 2014   Past Surgical History:  Past Surgical History:  Procedure Laterality Date  . ADENOIDECTOMY    . HIP FRACTURE SURGERY    . MASTECTOMY    . TONSILLECTOMY     HPI:  Monique Guerra a 83 y.o.femalewith medical history significant of COPD, GERD, BRCA in distant past, vocal cord paralysis. Patient presented to the ED at [facility] with c/o worsening moderate to severe dysphagia with gradual onset over the past 3 weeks. She c/o SOB. Patient was just hospitalized for COPD exacerbation / bronchitis at [facility]. Since the hospital stay patients condition has gradually worsened and now she has difficulty getting around and handling secretions. Strong concern for aspiration. Son reports he must hold head to L side for her to be able to swallow and she is unable to eat.02/29/16 MRI showed no acute intracranial infarct. Noted advanced cerebral atrophy with chronic microvascular ischemic disease.   Assessment / Plan / Recommendation Clinical Impression  Patient presents with overt signs of aspiration as evidenced by voice change following presentation of thin liquids, and delayed productive cough following presentation of pureed solids. Given history of vocal cord paralysis, complaints of "getting strangled" with PO intake, and esophageal complaints, recommend MBS to objectively evaluate oropharyngeal  swallowing function, rule out aspiration, aid in determining effectiveness of compensatory techniques and/or dietary modifications and screen cervical esophageal phase of swallow. Further recommendations to follow pending instrumental exam.    Aspiration Risk  Risk for inadequate nutrition/hydration;Moderate aspiration risk    Diet Recommendation NPO (pending instrumental)   Medication Administration: Via alternative means    Other  Recommendations Recommended Consults: Consider GI evaluation;Consider esophageal assessment Oral Care Recommendations: Oral care QID   Follow up Recommendations Other (comment) (TBD)      Frequency and Duration            Prognosis Prognosis for Safe Diet Advancement: Fair Barriers to Reach Goals: Severity of deficits      Swallow Study   General Date of Onset: 02/28/16 HPI: Monique Guerra a 83 y.o.femalewith medical history significant of COPD, GERD, BRCA in distant past, vocal cord paralysis. Patient presented to the ED at [facility] with c/o worsening moderate to severe dysphagia with gradual onset over the past 3 weeks. She c/o SOB. Patient was just hospitalized for COPD exacerbation / bronchitis at [facility]. Since the hospital stay patients condition has gradually worsened and now she has difficulty getting around and handling secretions. Strong concern for aspiration. Son reports he must hold head to L side for her to be able to swallow and she is unable to eat.02/29/16 MRI showed no acute intracranial infarct. Noted advanced cerebral atrophy with chronic microvascular ischemic disease. Type of Study: Bedside Swallow Evaluation Previous Swallow Assessment: none per chart Diet Prior to this Study: NPO Temperature Spikes Noted: No Respiratory Status: Nasal cannula History of Recent Intubation: No Behavior/Cognition: Alert;Cooperative;Requires  cueing Oral Cavity Assessment: Within Functional Limits Oral Care Completed by SLP: No Oral Cavity -  Dentition: Edentulous Vision: Functional for self-feeding Self-Feeding Abilities: Able to feed self Patient Positioning: Upright in bed Baseline Vocal Quality: Other (comment);Breathy;Low vocal intensity;Aphonic (VF paralysis (Right per pt report)) Volitional Cough: Weak Volitional Swallow: Able to elicit    Oral/Motor/Sensory Function Overall Oral Motor/Sensory Function: Within functional limits   Ice Chips Ice chips: Within functional limits   Thin Liquid Thin Liquid: Impaired Presentation: Cup Pharyngeal  Phase Impairments: Wet Vocal Quality    Nectar Thick Nectar Thick Liquid: Not tested   Honey Thick Honey Thick Liquid: Not tested   Puree Puree: Impaired Presentation: Self Fed;Spoon Pharyngeal Phase Impairments: Cough - Delayed   Solid   GO   Solid: Not tested        Aliene Altes 02/29/2016,1:56 PM  Deneise Lever, MS CF-SLP Speech-Language Pathologist [phone]

## 2016-02-29 NOTE — H&P (Addendum)
History and Physical    [name]:[id number] DOB: 09-Oct-1921 DOA: 02/28/2016   PCP: Mayra Neer, MD Chief Complaint:  Chief Complaint  Patient presents with  . Dysphagia    HPI: Monique Guerra is a 83 y.o. female with medical history significant of COPD, BRCA in distant past, vocal cord paralysis.  Patient presents to the ED at [facility] with c/o worsening moderate to severe dysphagia with gradual onset over the past 3 weeks.  She c/o SOB.  Patient was just hospitalized for COPD exacerbation / bronchitis at [facility].  Since the hospital stay patients condition has gradually worsened and now she has difficulty getting around and handling secretions.  Strong concern for aspiration.  Son reports he must hold head to L side for her to be able to swallow and she is unable to eat.  ED Course: O2 requirement at rest.  Patient given zosyn, vanc, solumedrol, breathing treatments.  CXR suspicious for RML PNA.  Work up also suspicious for LUE weakness and numbness.  CT head negative.  Review of Systems: As per HPI otherwise 10 point review of systems negative.    Past Medical History:  Diagnosis Date  . Acid reflux   . Anxiety   . Breast cancer ([place])   . COPD (chronic obstructive pulmonary disease) ([place])   . Environmental allergies   . Hyperthyroidism   . Macular degeneration   . Multiple thyroid nodules June 2014  . Pneumonia   . Vocal cord paralysis June 2014    Past Surgical History:  Procedure Laterality Date  . ADENOIDECTOMY    . HIP FRACTURE SURGERY    . MASTECTOMY    . TONSILLECTOMY       reports that she quit smoking about 28 years ago. She has never used smokeless tobacco. She reports that she does not drink alcohol or use drugs.  Allergies  Allergen Reactions  . Other     Cough med-tachycardia  . Aminophylline Palpitations    No family history on file. No recent illness in family.   Prior to Admission medications   Medication Sig Start Date End Date  Taking? Authorizing Provider  ALBUTEROL IN Inhale into the lungs.   Yes Historical Provider, MD  aspirin 81 MG tablet Take 81 mg by mouth daily.   Yes Historical Provider, MD  Calcium Carbonate-Vitamin D (CALCIUM-D PO) Take by mouth.   Yes Historical Provider, MD  Famotidine (PEPCID PO) Take by mouth.   Yes Historical Provider, MD  lisinopril-hydrochlorothiazide (PRINZIDE,ZESTORETIC) 10-12.5 MG per tablet Take 1 tablet by mouth daily.   Yes Historical Provider, MD  Multiple Vitamin (MULTIVITAMIN) tablet Take 1 tablet by mouth daily.   Yes Historical Provider, MD  azithromycin (ZITHROMAX Z-PAK) 250 MG tablet Use as directed 09/10/15   Drenda Freeze, MD  pravastatin (PRAVACHOL) 40 MG tablet Take 40 mg by mouth daily.    Historical Provider, MD    Physical Exam: Vitals:   02/29/16 0030 02/29/16 0100 02/29/16 0130 02/29/16 0256  BP: 136/68 136/77 137/74 136/66  Pulse: 105 106 103 100  Resp: 23 21 (!) 27 18  Temp:   98.2 F (36.8 C) 97.9 F (36.6 C)  TempSrc:   Oral Oral  SpO2: 90% 90% 93% 93%  Weight:      Height:          Constitutional: NAD, calm, comfortable Eyes: PERRL, lids and conjunctivae normal ENMT: Mucous membranes are moist. Posterior pharynx clear of any exudate or lesions.Normal dentition.  Neck: normal,  supple, no masses, no thyromegaly Respiratory: clear to auscultation bilaterally, no wheezing, no crackles. Normal respiratory effort. No accessory muscle use.  Cardiovascular: Regular rate and rhythm, no murmurs / rubs / gallops. No extremity edema. 2+ pedal pulses. No carotid bruits.  Abdomen: no tenderness, no masses palpated. No hepatosplenomegaly. Bowel sounds positive.  Musculoskeletal: no clubbing / cyanosis. No joint deformity upper and lower extremities. Good ROM, no contractures. Normal muscle tone.  Skin: no rashes, lesions, ulcers. No induration Neurologic: CN 2-12 grossly intact. Sensation intact, DTR normal. Strength 5/5 in all 4.  Psychiatric: Normal  judgment and insight. Alert and oriented x 3. Normal mood.    Labs on Admission: I have personally reviewed following labs and imaging studies  CBC:  Recent Labs Lab 02/28/16 2154  WBC 10.7*  NEUTROABS 8.1*  HGB 12.7  HCT 38.5  MCV 90.8  PLT 161   Basic Metabolic Panel:  Recent Labs Lab 02/28/16 2154  NA 140  K 3.2*  CL 101  CO2 30  GLUCOSE 116*  BUN 16  CREATININE 0.62  CALCIUM 9.1   GFR: Estimated Creatinine Clearance: 33.9 mL/min (by C-G formula based on SCr of 0.62 mg/dL). Liver Function Tests:  Recent Labs Lab 02/28/16 2154  AST 17  ALT 14  ALKPHOS 91  BILITOT 0.4  PROT 5.9*  ALBUMIN 3.2*   No results for input(s): LIPASE, AMYLASE in the last 168 hours. No results for input(s): AMMONIA in the last 168 hours. Coagulation Profile:  Recent Labs Lab 02/28/16 2154  INR 0.98   Cardiac Enzymes:  Recent Labs Lab 02/28/16 2154  TROPONINI <0.03   BNP (last 3 results) No results for input(s): PROBNP in the last 8760 hours. HbA1C: No results for input(s): HGBA1C in the last 72 hours. CBG: No results for input(s): GLUCAP in the last 168 hours. Lipid Profile: No results for input(s): CHOL, HDL, LDLCALC, TRIG, CHOLHDL, LDLDIRECT in the last 72 hours. Thyroid Function Tests: No results for input(s): TSH, T4TOTAL, FREET4, T3FREE, THYROIDAB in the last 72 hours. Anemia Panel: No results for input(s): VITAMINB12, FOLATE, FERRITIN, TIBC, IRON, RETICCTPCT in the last 72 hours. Urine analysis:    Component Value Date/Time   COLORURINE YELLOW 02/28/2016 2219   APPEARANCEUR CLEAR 02/28/2016 2219   LABSPEC 1.015 02/28/2016 2219   PHURINE 6.5 02/28/2016 2219   GLUCOSEU NEGATIVE 02/28/2016 2219   HGBUR NEGATIVE 02/28/2016 2219   BILIRUBINUR NEGATIVE 02/28/2016 2219   KETONESUR NEGATIVE 02/28/2016 2219   PROTEINUR NEGATIVE 02/28/2016 2219   UROBILINOGEN 0.2 08/20/2014 1215   NITRITE NEGATIVE 02/28/2016 2219   LEUKOCYTESUR NEGATIVE 02/28/2016 2219    Sepsis Labs: '@LABRCNTIP'$ (procalcitonin:4,lacticidven:4) )No results found for this or any previous visit (from the past 240 hour(s)).   Radiological Exams on Admission: Dg Neck Soft Tissue  Result Date: 02/28/2016 CLINICAL DATA:  83 year old female with difficulty swallowing and dysphagia. EXAM: NECK SOFT TISSUES - 1+ VIEW COMPARISON:  Neck radiograph dated 09/10/2015 FINDINGS: There is no evidence of retropharyngeal soft tissue swelling or epiglottic enlargement. The cervical airway is unremarkable and no radio-opaque foreign body identified. Stable calcification noted in the region of the larynx. There is osteopenia with advanced degenerative changes of the spine. Grade 1 C3-C4 anterolisthesis. IMPRESSION: Unremarkable soft tissue radiograph of the neck. Electronically Signed   By: Anner Crete M.D.   On: 02/28/2016 21:51   Dg Chest 2 View  Result Date: 02/28/2016 CLINICAL DATA:  83 year old female with dysphagia. EXAM: CHEST  2 VIEW COMPARISON:  Chest radiograph dated 01/23/2016  FINDINGS: Two views of the chest demonstrate consolidative changes along the right minor fissure involving the right middle lobe. There is silhouetting of the right cardiac border on the frontal view. There is emphysematous changes of the lungs. No pleural effusion or pneumothorax. The cardiac silhouette is within normal limits. There is osteopenia with degenerative changes of the spine and shoulders. No acute osseous pathology. IMPRESSION: Right middle lobe atelectasis versus pneumonia. Clinical correlation and follow-up to resolution is recommended. Electronically Signed   By: Anner Crete M.D.   On: 02/28/2016 21:47   Ct Head Wo Contrast  Result Date: 02/28/2016 CLINICAL DATA:  83 year old female with difficulty swallowing and dysphagia. EXAM: CT HEAD WITHOUT CONTRAST TECHNIQUE: Contiguous axial images were obtained from the base of the skull through the vertex without intravenous contrast. COMPARISON:  Head  CT dated 09/10/2015 FINDINGS: Brain: There is moderate age-related atrophy and chronic microvascular ischemic changes. There is no acute intracranial hemorrhage. No mass effect or midline shift noted. No extra-axial fluid collection. Vascular: No hyperdense vessel or unexpected calcification. Skull: Normal. Negative for fracture or focal lesion. Sinuses/Orbits: No acute finding. Other: None IMPRESSION: No acute intracranial hemorrhage. Age-related atrophy and chronic microvascular ischemic disease. Electronically Signed   By: Anner Crete M.D.   On: 02/28/2016 21:55    EKG: Independently reviewed.  Assessment/Plan Principal Problem:   HCAP (healthcare-associated pneumonia) Active Problems:   Left-sided weakness   Right middle lobe pneumonia ([place])   Acute respiratory failure with hypoxia ([place])    1. HCAP - RML PNA 1. PNA pathway 2. Cultures pending 3. Cefepime and vanc 2. Acute resp failure with hypoxia - 1. Due to #1 and COPD exacerbation 2. Prednisone daily 3. Adult wheeze protocol 4. COPD pathway 3. L sided weakness and dysphagia - 1. NPO until SLP can eval in AM 2. MRI brain ordered to r/o stroke 3. Neuro consulted by EDP 4. If stroke positive, then needs remainder of stroke pathway ordered.   DVT prophylaxis: Lovenox Code Status: DNR confirmed with Son Family Communication: Son at bedside Consults called: Neuro Admission status: Admit to inpatient   Etta Quill DO [facility] Pager [phone] from 7PM-7AM  If 7AM-7PM, please contact the day physician for the patient [email or web address] Password [email or web address]  02/29/2016, 3:17 AM

## 2016-02-29 NOTE — Progress Notes (Signed)
Modified Barium Swallow Progress Note  Patient Details  Name: Monique Guerra MRN: [id number] Date of Birth: [birth year]  Today's Date: 02/29/2016  Modified Barium Swallow completed.  Full report located under Chart Review in the Imaging Section.  Brief recommendations include the following:  Clinical Impression  Patient presents with moderate oral dysphagia, mild pharyngeal dysphagia, and suspected primary esophageal dysphagia. Aspiration of thin liquids x1 during the swallow when taken with barium tablet due to premature spillage. No other aspiration or penetration observed. Oral phase noted for lingual pumping, reduced anterior to posterior transit with solids. Pharyngeal phase of swallowing was remarkable for adequate tongue base retraction, adequate hyolaryngeal excursion and pharyngeal constriction. Epiglottic closure was complete. The cervical esophageal segment was remarkable for reduced UES relaxation, restriction of bolus flow and intermittent backflow into the pharynx. Adminstered pill partially crushed in puree; patient retained a portion of tablet in the valleculae which required multiple swallows of water to clear. Effective techniques include effortful swallow to clear pharyngeal residual, alternating solids and liquids. Recommend dysphagia 1 diet with thin liquids, medications well crushed in puree. Patient remains at mild risk for aspiration of backflow into the pharynx; as a result an esophageal assessment is highly recommended.   Swallow Evaluation Recommendations   Recommended Consults: Consider GI evaluation;Consider esophageal assessment   SLP Diet Recommendations: Dysphagia 1 (Puree) solids;Thin liquid       Medication Administration: Crushed with puree   Supervision: Full supervision/cueing for compensatory strategies;Staff to assist with self feeding   Compensations: Slow rate;Small sips/bites;Minimize environmental distractions;Follow solids with liquid;Effortful  swallow   Postural Changes: Seated upright at 90 degrees   Oral Care Recommendations: Oral care BID     Monique Guerra, [place] CF-SLP Speech-Language Pathologist [phone]   Aliene Altes 02/29/2016,6:04 PM

## 2016-02-29 NOTE — Consult Note (Signed)
Admission H&P    Chief Complaint: Progressive dysphagia and weakness over several weeks.  HPI: Monique Guerra is an 83 y.o. female with a history of vocal cord paralysis. Years, macular degeneration, hypothyroidism, COPD and breast cancer with left mastectomy, admitted with acute pneumonia, possibly aspiration, and progressive dysphagia and weakness. Patient indicated she has not unable to walk for at least past week or more. CT scan of her head showed no acute intracranial abnormality. MRI showed no acute intracranial abnormality as well. Chest x-ray showed infiltrate involving right middle lobe. Patient was afebrile. She was started on IV Zosyn and vancomycin.  Past Medical History:  Diagnosis Date  . Acid reflux   . Anxiety   . Breast cancer ([place])   . COPD (chronic obstructive pulmonary disease) ([place])   . Environmental allergies   . Hyperthyroidism   . Macular degeneration   . Multiple thyroid nodules June 2014  . Pneumonia   . Vocal cord paralysis June 2014    Past Surgical History:  Procedure Laterality Date  . ADENOIDECTOMY    . HIP FRACTURE SURGERY    . MASTECTOMY    . TONSILLECTOMY      No family history on file. Social History:  reports that she quit smoking about 28 years ago. She has never used smokeless tobacco. She reports that she does not drink alcohol or use drugs.  Allergies:  Allergies  Allergen Reactions  . Other     Cough med-tachycardia  . Aminophylline Palpitations    Medications Prior to Admission  Medication Sig Dispense Refill  . ALBUTEROL IN Inhale into the lungs.    Marland Kitchen aspirin 81 MG tablet Take 81 mg by mouth daily.    . Calcium Carbonate-Vitamin D (CALCIUM-D PO) Take by mouth.    . Famotidine (PEPCID PO) Take by mouth.    Marland Kitchen lisinopril-hydrochlorothiazide (PRINZIDE,ZESTORETIC) 10-12.5 MG per tablet Take 1 tablet by mouth daily.    . Multiple Vitamin (MULTIVITAMIN) tablet Take 1 tablet by mouth daily.    Marland Kitchen azithromycin (ZITHROMAX Z-PAK) 250  MG tablet Use as directed 6 tablet 0  . pravastatin (PRAVACHOL) 40 MG tablet Take 40 mg by mouth daily.      ROS: History obtained from chart review  General ROS: Progressive weakness and reduced appetite Psychological ROS: negative for - behavioral disorder, hallucinations, memory difficulties, mood swings or suicidal ideation Ophthalmic ROS: negative for - blurry vision, double vision, eye pain or loss of vision ENT ROS: Chronic vocal cord paralysis and new onset progressive dysphagia Allergy and Immunology ROS: negative for - hives or itchy/watery eyes Hematological and Lymphatic ROS: negative for - bleeding problems, bruising or swollen lymph nodes Endocrine ROS: negative for - galactorrhea, hair pattern changes, polydipsia/polyuria or temperature intolerance Respiratory ROS: Shortness of breath with productive cough Cardiovascular ROS: negative for - chest pain, dyspnea on exertion, edema or irregular heartbeat Gastrointestinal ROS: Abdominal pain Genito-Urinary ROS: negative for - dysuria, hematuria, incontinence or urinary frequency/urgency Musculoskeletal ROS: negative for - joint swelling or muscular weakness Neurological ROS: as noted in HPI Dermatological ROS: negative for rash and skin lesion changes  Physical Examination: Blood pressure 129/63, pulse (!) 103, temperature 97.9 F (36.6 C), temperature source Oral, resp. rate 18, height '5\' 1"'$  (1.549 m), weight 52.2 kg (115 lb), SpO2 97 %.  HEENT-  Normocephalic, no lesions, without obvious abnormality.  Normal external eye and conjunctiva.  Normal TM's bilaterally.  Normal auditory canals and external ears. Normal external nose, mucus membranes and septum.  Normal pharynx. Neck supple with no masses, nodes, nodules or enlargement. Cardiovascular - regular rate and rhythm, S1, S2 normal, no murmur, click, rub or gallop Lungs - chest clear, no wheezing, rales, normal symmetric air entry Abdomen - soft, non-tender; bowel sounds  normal; no masses,  no organomegaly Extremities - no joint deformities, effusion, or inflammation  Neurologic Examination: Mental Status: Alert, oriented, no acute distress.  Speech fluent without evidence of aphasia. Able to follow commands without difficulty. Cranial Nerves: II-Visual fields were normal. III/IV/VI-Pupils were equal and reacted normally to light. Extraocular movements were full and conjugate.    V/VII-no facial numbness and no facial weakness. VIII-normal. X-moderate hoarseness; no dysarthria; symmetrical palatal movement. XI: trapezius strength/neck flexion strength normal bilaterally XII-midline tongue extension with normal strength. Motor: Normal strength of extremities proximally and distally; variable effort with frequent give way; no atrophy; no fasciculations noted. Sensory: Normal throughout. Deep Tendon Reflexes: 2+ and symmetric in upper extremities and trace to 1+ at knees. Plantars: Flexor bilaterally Cerebellar: Normal finger-to-nose testing. Carotid auscultation: Normal  Results for orders placed or performed during the hospital encounter of 02/28/16 (from the past 48 hour(s))  CBC with Differential/Platelet     Status: Abnormal   Collection Time: 02/28/16  9:54 PM  Result Value Ref Range   WBC 10.7 (H) 4.0 - 10.5 K/uL   RBC 4.24 3.87 - 5.11 MIL/uL   Hemoglobin 12.7 12.0 - 15.0 g/dL   HCT 38.5 36.0 - 46.0 %   MCV 90.8 78.0 - 100.0 fL   MCH 30.0 26.0 - 34.0 pg   MCHC 33.0 30.0 - 36.0 g/dL   RDW 15.6 (H) 11.5 - 15.5 %   Platelets 288 150 - 400 K/uL   Neutrophils Relative % 75 %   Neutro Abs 8.1 (H) 1.7 - 7.7 K/uL   Lymphocytes Relative 11 %   Lymphs Abs 1.2 0.7 - 4.0 K/uL   Monocytes Relative 9 %   Monocytes Absolute 0.9 0.1 - 1.0 K/uL   Eosinophils Relative 5 %   Eosinophils Absolute 0.5 0.0 - 0.7 K/uL   Basophils Relative 0 %   Basophils Absolute 0.0 0.0 - 0.1 K/uL  Comprehensive metabolic panel     Status: Abnormal   Collection Time:  02/28/16  9:54 PM  Result Value Ref Range   Sodium 140 135 - 145 mmol/L   Potassium 3.2 (L) 3.5 - 5.1 mmol/L   Chloride 101 101 - 111 mmol/L   CO2 30 22 - 32 mmol/L   Glucose, Bld 116 (H) 65 - 99 mg/dL   BUN 16 6 - 20 mg/dL   Creatinine, Ser 0.62 0.44 - 1.00 mg/dL   Calcium 9.1 8.9 - 10.3 mg/dL   Total Protein 5.9 (L) 6.5 - 8.1 g/dL   Albumin 3.2 (L) 3.5 - 5.0 g/dL   AST 17 15 - 41 U/L   ALT 14 14 - 54 U/L   Alkaline Phosphatase 91 38 - 126 U/L   Total Bilirubin 0.4 0.3 - 1.2 mg/dL   GFR calc non Af Amer >60 >60 mL/min   GFR calc Af Amer >60 >60 mL/min    Comment: (NOTE) The eGFR has been calculated using the CKD EPI equation. This calculation has not been validated in all clinical situations. eGFR's persistently <60 mL/min signify possible Chronic Kidney Disease.    Anion gap 9 5 - 15  Brain natriuretic peptide     Status: None   Collection Time: 02/28/16  9:54 PM  Result Value Ref  Range   B Natriuretic Peptide 37.1 0.0 - 100.0 pg/mL  Troponin I     Status: None   Collection Time: 02/28/16  9:54 PM  Result Value Ref Range   Troponin I <0.03 <0.03 ng/mL  Protime-INR     Status: None   Collection Time: 02/28/16  9:54 PM  Result Value Ref Range   Prothrombin Time 13.0 11.4 - 15.2 seconds   INR 0.98   APTT     Status: None   Collection Time: 02/28/16  9:54 PM  Result Value Ref Range   aPTT 28 24 - 36 seconds  Urinalysis, Routine w reflex microscopic     Status: None   Collection Time: 02/28/16 10:19 PM  Result Value Ref Range   Color, Urine YELLOW YELLOW   APPearance CLEAR CLEAR   Specific Gravity, Urine 1.015 1.005 - 1.030   pH 6.5 5.0 - 8.0   Glucose, UA NEGATIVE NEGATIVE mg/dL   Hgb urine dipstick NEGATIVE NEGATIVE   Bilirubin Urine NEGATIVE NEGATIVE   Ketones, ur NEGATIVE NEGATIVE mg/dL   Protein, ur NEGATIVE NEGATIVE mg/dL   Nitrite NEGATIVE NEGATIVE   Leukocytes, UA NEGATIVE NEGATIVE    Comment: Microscopic not done on urines with negative protein, blood,  leukocytes, nitrite, or glucose < 500 mg/dL.  I-Stat CG4 Lactic Acid, ED     Status: None   Collection Time: 02/28/16 10:46 PM  Result Value Ref Range   Lactic Acid, Venous 0.82 0.5 - 1.9 mmol/L   Dg Neck Soft Tissue  Result Date: 02/28/2016 CLINICAL DATA:  83 year old female with difficulty swallowing and dysphagia. EXAM: NECK SOFT TISSUES - 1+ VIEW COMPARISON:  Neck radiograph dated 09/10/2015 FINDINGS: There is no evidence of retropharyngeal soft tissue swelling or epiglottic enlargement. The cervical airway is unremarkable and no radio-opaque foreign body identified. Stable calcification noted in the region of the larynx. There is osteopenia with advanced degenerative changes of the spine. Grade 1 C3-C4 anterolisthesis. IMPRESSION: Unremarkable soft tissue radiograph of the neck. Electronically Signed   By: Anner Crete M.D.   On: 02/28/2016 21:51   Dg Chest 2 View  Result Date: 02/28/2016 CLINICAL DATA:  83 year old female with dysphagia. EXAM: CHEST  2 VIEW COMPARISON:  Chest radiograph dated 01/23/2016 FINDINGS: Two views of the chest demonstrate consolidative changes along the right minor fissure involving the right middle lobe. There is silhouetting of the right cardiac border on the frontal view. There is emphysematous changes of the lungs. No pleural effusion or pneumothorax. The cardiac silhouette is within normal limits. There is osteopenia with degenerative changes of the spine and shoulders. No acute osseous pathology. IMPRESSION: Right middle lobe atelectasis versus pneumonia. Clinical correlation and follow-up to resolution is recommended. Electronically Signed   By: Anner Crete M.D.   On: 02/28/2016 21:47   Ct Head Wo Contrast  Result Date: 02/28/2016 CLINICAL DATA:  83 year old female with difficulty swallowing and dysphagia. EXAM: CT HEAD WITHOUT CONTRAST TECHNIQUE: Contiguous axial images were obtained from the base of the skull through the vertex without intravenous  contrast. COMPARISON:  Head CT dated 09/10/2015 FINDINGS: Brain: There is moderate age-related atrophy and chronic microvascular ischemic changes. There is no acute intracranial hemorrhage. No mass effect or midline shift noted. No extra-axial fluid collection. Vascular: No hyperdense vessel or unexpected calcification. Skull: Normal. Negative for fracture or focal lesion. Sinuses/Orbits: No acute finding. Other: None IMPRESSION: No acute intracranial hemorrhage. Age-related atrophy and chronic microvascular ischemic disease. Electronically Signed   By: Laren Everts.D.  On: 02/28/2016 21:55   Mr Brain Wo Contrast  Result Date: 02/29/2016 CLINICAL DATA:  Initial evaluation for worsening dysphagia. Evaluate for stroke. EXAM: MRI HEAD WITHOUT CONTRAST TECHNIQUE: Multiplanar, multiecho pulse sequences of the brain and surrounding structures were obtained without intravenous contrast. COMPARISON:  Prior CT from 02/28/2016. FINDINGS: Brain: Diffuse prominence of the CSF containing spaces compatible with generalized cerebral atrophy. Atrophy most probable it within the anterior temporal lobes bilaterally. Patchy and confluent T2/FLAIR hyperintensity within the periventricular and deep white matter both cerebral hemispheres most compatible with chronic small vessel ischemic disease, advanced in nature. Chronic microvascular ischemic changes present within the pons as well. No abnormal foci of restricted diffusion to suggest acute or subacute ischemia. Gray-white matter differentiation maintained. No evidence for acute intracranial hemorrhage. No made of a subcentimeter focus of susceptibility artifact within the left occipital lobe, likely a small chronic microhemorrhage. No mass lesion, midline shift or mass effect. Ventricular prominence related to global parenchymal volume loss without hydrocephalus. No extra-axial fluid collection. Major dural sinuses are grossly patent. Pituitary gland normal. Vascular:  Major intracranial vascular flow voids are maintained. Skull and upper cervical spine: Craniocervical junction within normal limits. Grade 1 anterolisthesis of C3 on C4 noted. No significant stenosis within the visualized upper cervical spine. Bone marrow signal intensity within normal limits. No scalp soft tissue abnormality. Sinuses/Orbits: Globes and orbital soft tissues within normal limits. Patient is status post cataract extraction bilaterally. Paranasal sinuses are clear. Trace opacity noted within the right mastoid air cells. Left mastoid air cells clear. IMPRESSION: 1. No acute intracranial infarct or other process identified. 2. A advanced cerebral atrophy with chronic microvascular ischemic disease. Electronically Signed   By: Jeannine Boga M.D.   On: 02/29/2016 06:35    Assessment/Plan 83 year old lady with chronic vocal cord paralysis presenting with progressive dysphagia as well as probable aspiration pneumonia, and complaint of generalized weakness. Examination was unremarkable except for hoarseness. Etiology for dysphagia is unclear.  Recommendations: 1. Speech therapy consult for evaluation of dysphagia 2. Physical therapy consult will evaluation of gait and extremity strength 3. No indication for stroke workup at this point  We will continue to follow this patient with you.  C.R. Nicole Kindred, MD [facility] [phone]  02/29/2016, 6:41 AM

## 2016-02-29 NOTE — Progress Notes (Signed)
PROGRESS NOTE                                                                                                                                                                                                             Patient Demographics:    Monique Guerra, is a 83 y.o. female, DOB - 08-May-1921, EUM:[id number]  Admit date - 02/28/2016   Admitting Physician Lily Kocher, MD  Outpatient Primary MD for the patient is Mayra Neer, MD  LOS - 0  Chief Complaint  Patient presents with  . Dysphagia      Brief Narrative   83 y.o. female with medical history significant of COPD, BRCA in distant past, vocal cord paralysis.  Patient presents to the ED at [facility] with c/o worsening moderate to severe dysphagia with gradual onset over the past 3 weeks.  She c/o SOB.  Patient was just hospitalized for COPD exacerbation / bronchitis at [facility]. Workup significant for right middle lobe infiltrate, admitted for HCAP treatment.   Subjective:    [name] today has, No headache, No chest pain, No abdominal pain - She complains of cough  Assessment  & Plan :    Principal Problem:   HCAP (healthcare-associated pneumonia) Active Problems:   Left-sided weakness   Right middle lobe pneumonia ([place])   Acute respiratory failure with hypoxia ([place])   Healthcare acquired pneumonia - Patient presents with cough, with known dysphagia, chest x-ray significant for mental lobe infiltrate. - Most likely aspiration pneumonia, empirically on IV vancomycin and cefepime, will discontinue vancomycin, will switch cefepime to Zosyn for better coverage for aspiration pneumonia. - Follow blood cultures, sputum cultures and strep pneumoniae antigen  Dysphagia - Seems to be progressive problem, significantly worsened over last 3 weeks, supposed to follow the GI on Monday for further evaluation. - Consult SLP, will obtain DG esophagus, pending findings will consider GI  consult  Acute respiratory failure with hypoxia - Continue to pneumonia and COPD - No significant wheezing, continue with oral prednisone, nebs as needed. - Continue with pneumonia treatment  ?? left-sided weakness and dysphagia - Physical exam essentially normal, neuro consult appreciated, MRI brain with no evidence of acute CVA,, her symptoms related to his weakness from pneumonia, and known chronic vocal cord dysfunction.  Hypertension  - Continue to room to monitor, appears not taking any home medication  Hyperthyroidism - Continue with methimazole  Code Status : DNR  Family Communication  : D/W son via phone  Disposition Plan  : pending further wok up.  Consults  :  Neurology  Procedures  : None  DVT Prophylaxis  :  Lovenox -  SCDs  Lab Results  Component Value Date   PLT 288 02/28/2016    Antibiotics  :   Anti-infectives    Start     Dose/Rate Route Frequency Ordered Stop   02/29/16 2200  vancomycin (VANCOCIN) IVPB 750 mg/150 ml premix     750 mg 150 mL/hr over 60 Minutes Intravenous Every 24 hours 02/29/16 0257 03/07/16 2159   02/29/16 1400  piperacillin-tazobactam (ZOSYN) IVPB 3.375 g  Status:  Discontinued     3.375 g 12.5 mL/hr over 240 Minutes Intravenous Every 8 hours 02/29/16 0739 02/29/16 0742   02/29/16 0800  piperacillin-tazobactam (ZOSYN) IVPB 3.375 g     3.375 g 12.5 mL/hr over 240 Minutes Intravenous Every 8 hours 02/29/16 0742     02/29/16 0745  piperacillin-tazobactam (ZOSYN) IVPB 3.375 g  Status:  Discontinued     3.375 g 12.5 mL/hr over 240 Minutes Intravenous Every 8 hours 02/29/16 0737 02/29/16 0739   02/29/16 0600  ceFEPIme (MAXIPIME) 1 g in dextrose 5 % 50 mL IVPB  Status:  Discontinued     1 g 100 mL/hr over 30 Minutes Intravenous Every 24 hours 02/29/16 0253 02/29/16 0728   02/28/16 2300  piperacillin-tazobactam (ZOSYN) IVPB 3.375 g     3.375 g 100 mL/hr over 30 Minutes Intravenous  Once 02/28/16 2248 02/28/16 2320   02/28/16 2300   piperacillin-tazobactam (ZOSYN) IVPB 3.375 g  Status:  Discontinued     3.375 g 100 mL/hr over 30 Minutes Intravenous  Once 02/28/16 2248 02/28/16 2249   02/28/16 2215  vancomycin (VANCOCIN) IVPB 1000 mg/200 mL premix     1,000 mg 200 mL/hr over 60 Minutes Intravenous  Once 02/28/16 2213 02/29/16 0125   02/28/16 2215  piperacillin-tazobactam (ZOSYN) IVPB 3.375 g  Status:  Discontinued     3.375 g 12.5 mL/hr over 240 Minutes Intravenous  Once 02/28/16 2213 02/28/16 2249        Objective:   Vitals:   02/29/16 0339 02/29/16 0447 02/29/16 0738 02/29/16 1230  BP:  129/63    Pulse:  (!) 103    Resp:  18    Temp:  97.9 F (36.6 C)    TempSrc:  Oral    SpO2: 93% 97% 94% 95%  Weight:      Height:        Wt Readings from Last 3 Encounters:  02/28/16 52.2 kg (115 lb)  09/11/15 54.4 kg (120 lb)  09/10/15 54.4 kg (120 lb)     Intake/Output Summary (Last 24 hours) at 02/29/16 1309 Last data filed at 02/29/16 1137  Gross per 24 hour  Intake              800 ml  Output              324 ml  Net              476 ml     Physical Exam  Awake Alert, Frail, appropriate Supple Neck,No JVD,  Symmetrical Chest wall movement, Good air movement bilaterally, CTAB RRR,No Gallops,Rubs or new Murmurs, No Parasternal Heave +ve B.Sounds, Abd Soft, No tenderness, No rebound - guarding or rigidity. No Cyanosis, Clubbing or edema, No new Rash or bruise  Data Review:    CBC  Recent Labs Lab 02/28/16 2154  WBC 10.7*  HGB 12.7  HCT 38.5  PLT 288  MCV 90.8  MCH 30.0  MCHC 33.0  RDW 15.6*  LYMPHSABS 1.2  MONOABS 0.9  EOSABS 0.5  BASOSABS 0.0    Chemistries   Recent Labs Lab 02/28/16 2154  NA 140  K 3.2*  CL 101  CO2 30  GLUCOSE 116*  BUN 16  CREATININE 0.62  CALCIUM 9.1  AST 17  ALT 14  ALKPHOS 91  BILITOT 0.4   ------------------------------------------------------------------------------------------------------------------ No results for input(s): CHOL, HDL,  LDLCALC, TRIG, CHOLHDL, LDLDIRECT in the last 72 hours.  No results found for: HGBA1C ------------------------------------------------------------------------------------------------------------------ No results for input(s): TSH, T4TOTAL, T3FREE, THYROIDAB in the last 72 hours.  Invalid input(s): FREET3 ------------------------------------------------------------------------------------------------------------------ No results for input(s): VITAMINB12, FOLATE, FERRITIN, TIBC, IRON, RETICCTPCT in the last 72 hours.  Coagulation profile  Recent Labs Lab 02/28/16 2154  INR 0.98    No results for input(s): DDIMER in the last 72 hours.  Cardiac Enzymes  Recent Labs Lab 02/28/16 2154  TROPONINI <0.03   ------------------------------------------------------------------------------------------------------------------    Component Value Date/Time   BNP 37.1 02/28/2016 2154    Inpatient Medications  Scheduled Meds: . aspirin  81 mg Oral Daily  . enoxaparin (LOVENOX) injection  40 mg Subcutaneous Q24H  . lisinopril  10 mg Oral Daily   And  . hydrochlorothiazide  12.5 mg Oral Daily  . ipratropium-albuterol  3 mL Nebulization TID  . multivitamin with minerals  1 tablet Oral Daily  . piperacillin-tazobactam (ZOSYN)  IV  3.375 g Intravenous Q8H  . pravastatin  40 mg Oral q1800  . predniSONE  40 mg Oral Q breakfast  . vancomycin  750 mg Intravenous Q24H   Continuous Infusions: PRN Meds:.albuterol  Micro Results No results found for this or any previous visit (from the past 240 hour(s)).  Radiology Reports Dg Neck Soft Tissue  Result Date: 02/28/2016 CLINICAL DATA:  83 year old female with difficulty swallowing and dysphagia. EXAM: NECK SOFT TISSUES - 1+ VIEW COMPARISON:  Neck radiograph dated 09/10/2015 FINDINGS: There is no evidence of retropharyngeal soft tissue swelling or epiglottic enlargement. The cervical airway is unremarkable and no radio-opaque foreign body  identified. Stable calcification noted in the region of the larynx. There is osteopenia with advanced degenerative changes of the spine. Grade 1 C3-C4 anterolisthesis. IMPRESSION: Unremarkable soft tissue radiograph of the neck. Electronically Signed   By: Anner Crete M.D.   On: 02/28/2016 21:51   Dg Chest 2 View  Result Date: 02/28/2016 CLINICAL DATA:  83 year old female with dysphagia. EXAM: CHEST  2 VIEW COMPARISON:  Chest radiograph dated 01/23/2016 FINDINGS: Two views of the chest demonstrate consolidative changes along the right minor fissure involving the right middle lobe. There is silhouetting of the right cardiac border on the frontal view. There is emphysematous changes of the lungs. No pleural effusion or pneumothorax. The cardiac silhouette is within normal limits. There is osteopenia with degenerative changes of the spine and shoulders. No acute osseous pathology. IMPRESSION: Right middle lobe atelectasis versus pneumonia. Clinical correlation and follow-up to resolution is recommended. Electronically Signed   By: Anner Crete M.D.   On: 02/28/2016 21:47   Ct Head Wo Contrast  Result Date: 02/28/2016 CLINICAL DATA:  83 year old female with difficulty swallowing and dysphagia. EXAM: CT HEAD WITHOUT CONTRAST TECHNIQUE: Contiguous axial images were obtained from the base of the skull through the vertex without intravenous contrast. COMPARISON:  Head CT  dated 09/10/2015 FINDINGS: Brain: There is moderate age-related atrophy and chronic microvascular ischemic changes. There is no acute intracranial hemorrhage. No mass effect or midline shift noted. No extra-axial fluid collection. Vascular: No hyperdense vessel or unexpected calcification. Skull: Normal. Negative for fracture or focal lesion. Sinuses/Orbits: No acute finding. Other: None IMPRESSION: No acute intracranial hemorrhage. Age-related atrophy and chronic microvascular ischemic disease. Electronically Signed   By: Anner Crete  M.D.   On: 02/28/2016 21:55   Mr Brain Wo Contrast  Result Date: 02/29/2016 CLINICAL DATA:  Initial evaluation for worsening dysphagia. Evaluate for stroke. EXAM: MRI HEAD WITHOUT CONTRAST TECHNIQUE: Multiplanar, multiecho pulse sequences of the brain and surrounding structures were obtained without intravenous contrast. COMPARISON:  Prior CT from 02/28/2016. FINDINGS: Brain: Diffuse prominence of the CSF containing spaces compatible with generalized cerebral atrophy. Atrophy most probable it within the anterior temporal lobes bilaterally. Patchy and confluent T2/FLAIR hyperintensity within the periventricular and deep white matter both cerebral hemispheres most compatible with chronic small vessel ischemic disease, advanced in nature. Chronic microvascular ischemic changes present within the pons as well. No abnormal foci of restricted diffusion to suggest acute or subacute ischemia. Gray-white matter differentiation maintained. No evidence for acute intracranial hemorrhage. No made of a subcentimeter focus of susceptibility artifact within the left occipital lobe, likely a small chronic microhemorrhage. No mass lesion, midline shift or mass effect. Ventricular prominence related to global parenchymal volume loss without hydrocephalus. No extra-axial fluid collection. Major dural sinuses are grossly patent. Pituitary gland normal. Vascular: Major intracranial vascular flow voids are maintained. Skull and upper cervical spine: Craniocervical junction within normal limits. Grade 1 anterolisthesis of C3 on C4 noted. No significant stenosis within the visualized upper cervical spine. Bone marrow signal intensity within normal limits. No scalp soft tissue abnormality. Sinuses/Orbits: Globes and orbital soft tissues within normal limits. Patient is status post cataract extraction bilaterally. Paranasal sinuses are clear. Trace opacity noted within the right mastoid air cells. Left mastoid air cells clear. IMPRESSION:  1. No acute intracranial infarct or other process identified. 2. A advanced cerebral atrophy with chronic microvascular ischemic disease. Electronically Signed   By: Jeannine Boga M.D.   On: 02/29/2016 06:35     Nizhoni Parlow M.D on 02/29/2016 at 1:09 PM  Between 7am to 7pm - Pager - [phone]  After 7pm go to [email or web address] - password [facility]  [facility] -  Office  [phone]

## 2016-02-29 NOTE — Progress Notes (Signed)
Patient accepted to inpatient status with telemetry monitoring.  Diagnosis HCAP and possible stroke.  History of COPD, breast cancer, hyperthyroid, acid reflux.  Recent admission to [facility] for bronchitis.  Has declined since discharge.  She has difficulty swallowing, her voice has changed, persistent wet cough, increased wheezing.  Her son is concerned that she is aspirating.  However, she also has LUE weakness and numbness/tingling.  Chest xray shows RML infiltrate.  She is now requiring Supplemental oxygen continuously to maintain her O2 sat (previously used it prn).  Normal lactic acid.  She has received 500cc NS, vanc/zosyn, solumedrol, and albuterol neb.  I asked ED to call neurology consult re: stroke-like symptoms so that patient can be seen upon arrival to [facility].

## 2016-03-01 DIAGNOSIS — R1310 Dysphagia, unspecified: Secondary | ICD-10-CM

## 2016-03-01 LAB — BASIC METABOLIC PANEL
ANION GAP: 13 (ref 5–15)
BUN: 16 mg/dL (ref 6–20)
CHLORIDE: 104 mmol/L (ref 101–111)
CO2: 27 mmol/L (ref 22–32)
Calcium: 9 mg/dL (ref 8.9–10.3)
Creatinine, Ser: 0.75 mg/dL (ref 0.44–1.00)
GFR calc Af Amer: >60 mL/min (ref >60)
GFR calc non Af Amer: >60 mL/min (ref >60)
GLUCOSE: 114 mg/dL — AB (ref 65–99)
Potassium: 3.2 mmol/L — ABNORMAL LOW (ref 3.5–5.1)
SODIUM: 144 mmol/L (ref 135–145)

## 2016-03-01 LAB — CBC
HCT: 35.6 % — ABNORMAL LOW (ref 36.0–46.0)
Hemoglobin: 11.2 g/dL — ABNORMAL LOW (ref 12.0–15.0)
MCH: 28.5 pg (ref 26.0–34.0)
MCHC: 31.5 g/dL (ref 30.0–36.0)
MCV: 90.6 fL (ref 78.0–100.0)
PLATELETS: 263 10*3/uL (ref 150–400)
RBC: 3.93 MIL/uL (ref 3.87–5.11)
RDW: 15.6 % — AB (ref 11.5–15.5)
WBC: 11.9 10*3/uL — AB (ref 4.0–10.5)

## 2016-03-01 MED ORDER — POTASSIUM CHLORIDE CRYS ER 20 MEQ PO TBCR
40.0000 meq | EXTENDED_RELEASE_TABLET | Freq: Four times a day (QID) | ORAL | Status: AC
  Administered 2016-03-01 (×2): 40 meq via ORAL
  Filled 2016-03-01 (×2): qty 2

## 2016-03-01 NOTE — Consult Note (Signed)
Subjective:   HPI  The patient is a 83 year old female who we are asked to see in regards to dysphagia and to determine if there is a specific esophageal cause for this. I spoke to her and her son by telephone today for the history. She has been experiencing dysphagia for years according to her and months according to the son. This seems to be both with solids and liquids and it seems to hang up in the upper neck portion. She was seen and evaluated by neurology who did not feel that she had a stroke. She does have a history of vocal cord paralysis in the past. She had a modified barium swallow which showed moderate oral phase dysphagia and mild pharyngeal phase dysphagia. There was aspiration of thin liquids. The cervical esophagus segment was remarkable for reduced upper esophageal sphincter relaxation and restriction of bolus flow and intermittent backflow into the pharynx. The speech pathologist recommended evaluation of the esophagus. A barium swallow (esophagram) is ordered for tomorrow  Review of Systems   Past Medical History:  Diagnosis Date  . Acid reflux   . Anxiety   . Breast cancer ([place])   . COPD (chronic obstructive pulmonary disease) ([place])   . Environmental allergies   . Hyperthyroidism   . Macular degeneration   . Multiple thyroid nodules June 2014  . Pneumonia   . Vocal cord paralysis June 2014   Past Surgical History:  Procedure Laterality Date  . ADENOIDECTOMY    . HIP FRACTURE SURGERY    . MASTECTOMY    . TONSILLECTOMY     Social History   Social History  . Marital status: Widowed    Spouse name: N/A  . Number of children: N/A  . Years of education: N/A   Occupational History  . Not on file.   Social History Main Topics  . Smoking status: Former Smoker    Quit date: 02/03/1988  . Smokeless tobacco: Never Used  . Alcohol use No  . Drug use: No  . Sexual activity: Not on file   Other Topics Concern  . Not on file   Social History Narrative  . No  narrative on file   family history is not on file.  Current Facility-Administered Medications:  .  0.9 %  sodium chloride infusion, , Intravenous, Continuous, Albertine Patricia, MD, Last Rate: 50 mL/hr at 02/29/16 1626 .  acidophilus (RISAQUAD) capsule 2 capsule, 2 capsule, Oral, Daily, Albertine Patricia, MD, 2 capsule at 03/01/16 0852 .  albuterol (PROVENTIL) (2.5 MG/3ML) 0.083% nebulizer solution 2.5 mg, 2.5 mg, Nebulization, Q2H PRN, Etta Quill, DO, 2.5 mg at 02/29/16 0339 .  aspirin EC tablet 81 mg, 81 mg, Oral, Daily, Albertine Patricia, MD, 81 mg at 03/01/16 0853 .  budesonide (PULMICORT) nebulizer solution 2 mg, 2 mg, Nebulization, Daily, Silver Huguenin Elgergawy, MD, 0.25 mg at 03/01/16 0800 .  citalopram (CELEXA) tablet 10 mg, 10 mg, Oral, Daily, Albertine Patricia, MD, 10 mg at 03/01/16 0852 .  enoxaparin (LOVENOX) injection 40 mg, 40 mg, Subcutaneous, Q24H, Jared M Gardner, DO, 40 mg at 03/01/16 0853 .  famotidine (PEPCID) tablet 20 mg, 20 mg, Oral, QHS PRN, Silver Huguenin Elgergawy, MD .  guaiFENesin (MUCINEX) 12 hr tablet 600 mg, 600 mg, Oral, Q12H PRN, Silver Huguenin Elgergawy, MD .  ipratropium-albuterol (DUONEB) 0.5-2.5 (3) MG/3ML nebulizer solution 3 mL, 3 mL, Nebulization, TID, Lily Kocher, MD, 3 mL at 03/01/16 0800 .  ipratropium-albuterol (DUONEB) 0.5-2.5 (3) MG/3ML nebulizer  solution 3 mL, 3 mL, Nebulization, Q8H PRN, Albertine Patricia, MD .  methimazole (TAPAZOLE) tablet 2.5 mg, 2.5 mg, Oral, Once per day on Sun Sat, Dawood S Elgergawy, MD, 2.5 mg at 03/01/16 [id number] .  [START ON 03/02/2016] methimazole (TAPAZOLE) tablet 5 mg, 5 mg, Oral, Once per day on Mon Tue Wed Thu Fri, Dawood S Elgergawy, MD .  multivitamin with minerals tablet 1 tablet, 1 tablet, Oral, Daily, Etta Quill, DO, 1 tablet at 03/01/16 [id number] .  piperacillin-tazobactam (ZOSYN) IVPB 3.375 g, 3.375 g, Intravenous, Q8H, Haze Boyden, RPH, 3.375 g at 03/01/16 1317 .  pravastatin (PRAVACHOL) tablet 40 mg, 40 mg, Oral, q1800,  Etta Quill, DO, 40 mg at 02/29/16 1737 .  predniSONE (DELTASONE) tablet 40 mg, 40 mg, Oral, Q breakfast, Etta Quill, DO, 40 mg at 03/01/16 [id number] Allergies  Allergen Reactions  . Metoprolol Other (See Comments)    Elevated blood pressure - reported by [facility] and [facility] June 2017  . Other     Cough med-tachycardia  . Prednisone Other (See Comments)    Weakened immune system.- reported by [facility] and [facility] June 2017  . Protonix [Pantoprazole Sodium] Other (See Comments)    Caused elevated blood pressure  . Zoloft [Sertraline Hcl] Other (See Comments)    50 mg dose caused sleepiness  . Aminophylline Palpitations  . Esomeprazole Magnesium Nausea And Vomiting    Reported by [facility] and [facility] June 2015  . Omeprazole Magnesium Nausea And Vomiting    Reported by [facility] and [facility] November 2015     Objective:     BP 140/66 (BP Location: Right Arm)   Pulse 91   Temp 98.1 F (36.7 C) (Oral)   Resp 18   Ht 5\' 1"  (1.549 m)   Wt 52.2 kg (115 lb)   SpO2 93%   BMI 21.73 kg/m   No distress  Heart regular rhythm  Lungs clear  Abdomen soft nontender  Laboratory No components found for: D1    Assessment:     Dysphagia      Plan:     Proceed with barium swallow. It appears that she does have oral pharyngeal dysphagia. If there is no significant finding on barium swallow such as a stricture or obstructing lesion I would recommend that she work with speech pathology in regards to the swallowing.

## 2016-03-01 NOTE — Progress Notes (Addendum)
PROGRESS NOTE                                                                                                                                                                                                             Patient Demographics:    Monique Guerra, is a 83 y.o. female, DOB - 1921-02-12, VZC:[id number]  Admit date - 02/28/2016   Admitting Physician Lily Kocher, MD  Outpatient Primary MD for the patient is Mayra Neer, MD  LOS - 1  Chief Complaint  Patient presents with  . Dysphagia      Brief Narrative   83 y.o. female with medical history significant of COPD, BRCA in distant past, vocal cord paralysis.  Patient presents to the ED at [facility] with c/o worsening moderate to severe dysphagia with gradual onset over the past 3 weeks.  She c/o SOB.  Patient was just hospitalized for COPD exacerbation / bronchitis at [facility]. Workup significant for right middle lobe infiltrate, admitted for HCAP treatment.   Subjective:    Monique Guerra today has, No headache, No chest pain, No abdominal pain - She complains of cough  Assessment  & Plan :    Principal Problem:   HCAP (healthcare-associated pneumonia) Active Problems:   Left-sided weakness   Right middle lobe pneumonia ([place])   Acute respiratory failure with hypoxia ([place])   Healthcare acquired pneumonia - Patient presents with cough, with known dysphagia, chest x-ray significant for mental lobe infiltrate. - Most likely aspiration pneumonia, empirically on IV vancomycin and cefepime,discontinued vancomycin, switched cefepime to Zosyn for better coverage for aspiration pneumonia. - Follow blood cultures, sputum cultures and strep pneumoniae antigen  Dysphagia - Seems to be progressive problem, significantly worsened over last 3 weeks, supposed to follow the GI on Monday for further evaluation. - SLP consult appreciated , on dysphagia 1 with thin liquid, on MBS remarkable for reduced UES  relaxation, with restriction of bolus flow and intermittent backflow into the pharynx, so [facility] were consulted for esophageal evaluation.  Acute respiratory failure with hypoxia - Continue to pneumonia and COPD - No significant wheezing, continue with oral prednisone, nebs as needed. - Continue with pneumonia treatment  Questionable left-sided weakness on admission and dysphagia - Physical exam essentially normal, neuro consult appreciated, MRI brain with no evidence of acute CVA,, her symptoms related to his weakness from  pneumonia, and known chronic vocal cord dysfunction.  Hypertension  - Continue to room to monitor, appears not taking any home medication  Hyperthyroidism - Continue with methimazole  Hypokalemia - Repleted, recheck in a.m.  Code Status : DNR  Family Communication  : D/W son via phone 1/27  Disposition Plan  : pending further wok up.  Consults  :  Neurology  Procedures  : None  DVT Prophylaxis  :  Lovenox -  SCDs  Lab Results  Component Value Date   PLT 263 03/01/2016    Antibiotics  :   Anti-infectives    Start     Dose/Rate Route Frequency Ordered Stop   02/29/16 2200  vancomycin (VANCOCIN) IVPB 750 mg/150 ml premix  Status:  Discontinued     750 mg 150 mL/hr over 60 Minutes Intravenous Every 24 hours 02/29/16 0257 02/29/16 1310   02/29/16 1400  piperacillin-tazobactam (ZOSYN) IVPB 3.375 g  Status:  Discontinued     3.375 g 12.5 mL/hr over 240 Minutes Intravenous Every 8 hours 02/29/16 0739 02/29/16 0742   02/29/16 0800  piperacillin-tazobactam (ZOSYN) IVPB 3.375 g     3.375 g 12.5 mL/hr over 240 Minutes Intravenous Every 8 hours 02/29/16 0742     02/29/16 0745  piperacillin-tazobactam (ZOSYN) IVPB 3.375 g  Status:  Discontinued     3.375 g 12.5 mL/hr over 240 Minutes Intravenous Every 8 hours 02/29/16 0737 02/29/16 0739   02/29/16 0600  ceFEPIme (MAXIPIME) 1 g in dextrose 5 % 50 mL IVPB  Status:  Discontinued     1 g 100 mL/hr over 30  Minutes Intravenous Every 24 hours 02/29/16 0253 02/29/16 0728   02/28/16 2300  piperacillin-tazobactam (ZOSYN) IVPB 3.375 g     3.375 g 100 mL/hr over 30 Minutes Intravenous  Once 02/28/16 2248 02/28/16 2320   02/28/16 2300  piperacillin-tazobactam (ZOSYN) IVPB 3.375 g  Status:  Discontinued     3.375 g 100 mL/hr over 30 Minutes Intravenous  Once 02/28/16 2248 02/28/16 2249   02/28/16 2215  vancomycin (VANCOCIN) IVPB 1000 mg/200 mL premix     1,000 mg 200 mL/hr over 60 Minutes Intravenous  Once 02/28/16 2213 02/29/16 0125   02/28/16 2215  piperacillin-tazobactam (ZOSYN) IVPB 3.375 g  Status:  Discontinued     3.375 g 12.5 mL/hr over 240 Minutes Intravenous  Once 02/28/16 2213 02/28/16 2249        Objective:   Vitals:   02/29/16 2026 02/29/16 2113 03/01/16 0443 03/01/16 0802  BP:  121/64 140/66   Pulse:  (!) 107 91   Resp:  18 18   Temp:  98.5 F (36.9 C) 98.1 F (36.7 C)   TempSrc:  Oral Oral   SpO2: 96% 92% 100% 93%  Weight:      Height:        Wt Readings from Last 3 Encounters:  02/28/16 52.2 kg (115 lb)  09/11/15 54.4 kg (120 lb)  09/10/15 54.4 kg (120 lb)     Intake/Output Summary (Last 24 hours) at 03/01/16 1330 Last data filed at 03/01/16 0300  Gross per 24 hour  Intake           628.33 ml  Output                0 ml  Net           628.33 ml     Physical Exam  Awake Alert, Frail, appropriate Supple Neck,No JVD,  Symmetrical Chest wall movement, Good air   movement bilaterally, CTAB RRR,No Gallops,Rubs or new Murmurs, No Parasternal Heave +ve B.Sounds, Abd Soft, No tenderness, No rebound - guarding or rigidity. No Cyanosis, Clubbing or edema, No new Rash or bruise      Data Review:    CBC  Recent Labs Lab 02/28/16 2154 03/01/16 0518  WBC 10.7* 11.9*  HGB 12.7 11.2*  HCT 38.5 35.6*  PLT 288 263  MCV 90.8 90.6  MCH 30.0 28.5  MCHC 33.0 31.5  RDW 15.6* 15.6*  LYMPHSABS 1.2  --   MONOABS 0.9  --   EOSABS 0.5  --   BASOSABS 0.0  --      Chemistries   Recent Labs Lab 02/28/16 2154 03/01/16 0518  NA 140 144  K 3.2* 3.2*  CL 101 104  CO2 30 27  GLUCOSE 116* 114*  BUN 16 16  CREATININE 0.62 0.75  CALCIUM 9.1 9.0  AST 17  --   ALT 14  --   ALKPHOS 91  --   BILITOT 0.4  --    ------------------------------------------------------------------------------------------------------------------ No results for input(s): CHOL, HDL, LDLCALC, TRIG, CHOLHDL, LDLDIRECT in the last 72 hours.  No results found for: HGBA1C ------------------------------------------------------------------------------------------------------------------ No results for input(s): TSH, T4TOTAL, T3FREE, THYROIDAB in the last 72 hours.  Invalid input(s): FREET3 ------------------------------------------------------------------------------------------------------------------ No results for input(s): VITAMINB12, FOLATE, FERRITIN, TIBC, IRON, RETICCTPCT in the last 72 hours.  Coagulation profile  Recent Labs Lab 02/28/16 2154  INR 0.98    No results for input(s): DDIMER in the last 72 hours.  Cardiac Enzymes  Recent Labs Lab 02/28/16 2154  TROPONINI <0.03   ------------------------------------------------------------------------------------------------------------------    Component Value Date/Time   BNP 37.1 02/28/2016 2154    Inpatient Medications  Scheduled Meds: . acidophilus  2 capsule Oral Daily  . aspirin EC  81 mg Oral Daily  . budesonide  2 mg Nebulization Daily  . citalopram  10 mg Oral Daily  . enoxaparin (LOVENOX) injection  40 mg Subcutaneous Q24H  . ipratropium-albuterol  3 mL Nebulization TID  . methimazole  2.5 mg Oral Once per day on Sun Sat  . [START ON 03/02/2016] methimazole  5 mg Oral Once per day on Mon Tue Wed Thu Fri  . multivitamin with minerals  1 tablet Oral Daily  . piperacillin-tazobactam (ZOSYN)  IV  3.375 g Intravenous Q8H  . pravastatin  40 mg Oral q1800  . predniSONE  40 mg Oral Q breakfast    Continuous Infusions: . sodium chloride 50 mL/hr at 02/29/16 1626   PRN Meds:.albuterol, famotidine, guaiFENesin, ipratropium-albuterol  Micro Results Recent Results (from the past 240 hour(s))  Culture, blood (Routine X 2) w Reflex to ID Panel     Status: None (Preliminary result)   Collection Time: 02/28/16 10:25 PM  Result Value Ref Range Status   Specimen Description BLOOD RIGHT ARM  Final   Special Requests BOTTLES DRAWN AEROBIC AND ANAEROBIC 10ML EACH  Final   Culture   Final    NO GROWTH 1 DAY Performed at [facility], [place] [address] [place], [place] [address]    Report Status PENDING  Incomplete  Culture, blood (Routine X 2) w Reflex to ID Panel     Status: None (Preliminary result)   Collection Time: 02/28/16 10:40 PM  Result Value Ref Range Status   Specimen Description BLOOD RIGHT ARM  Final   Special Requests IN PEDIATRIC BOTTLE 2ML  Final   Culture   Final    NO GROWTH 1 DAY Performed at  [facility], [address], [place], [place] [address]    Report Status PENDING  Incomplete    Radiology Reports Dg Neck Soft Tissue  Result Date: 02/28/2016 CLINICAL DATA:  84 year old female with difficulty swallowing and dysphagia. EXAM: NECK SOFT TISSUES - 1+ VIEW COMPARISON:  Neck radiograph dated 09/10/2015 FINDINGS: There is no evidence of retropharyngeal soft tissue swelling or epiglottic enlargement. The cervical airway is unremarkable and no radio-opaque foreign body identified. Stable calcification noted in the region of the larynx. There is osteopenia with advanced degenerative changes of the spine. Grade 1 C3-C4 anterolisthesis. IMPRESSION: Unremarkable soft tissue radiograph of the neck. Electronically Signed   By: Anner Crete M.D.   On: 02/28/2016 21:51   Dg Chest 2 View  Result Date: 02/28/2016 CLINICAL DATA:  83 year old female with dysphagia. EXAM: CHEST  2 VIEW COMPARISON:  Chest radiograph dated 01/23/2016 FINDINGS: Two views of the chest  demonstrate consolidative changes along the right minor fissure involving the right middle lobe. There is silhouetting of the right cardiac border on the frontal view. There is emphysematous changes of the lungs. No pleural effusion or pneumothorax. The cardiac silhouette is within normal limits. There is osteopenia with degenerative changes of the spine and shoulders. No acute osseous pathology. IMPRESSION: Right middle lobe atelectasis versus pneumonia. Clinical correlation and follow-up to resolution is recommended. Electronically Signed   By: Anner Crete M.D.   On: 02/28/2016 21:47   Ct Head Wo Contrast  Result Date: 02/28/2016 CLINICAL DATA:  83 year old female with difficulty swallowing and dysphagia. EXAM: CT HEAD WITHOUT CONTRAST TECHNIQUE: Contiguous axial images were obtained from the base of the skull through the vertex without intravenous contrast. COMPARISON:  Head CT dated 09/10/2015 FINDINGS: Brain: There is moderate age-related atrophy and chronic microvascular ischemic changes. There is no acute intracranial hemorrhage. No mass effect or midline shift noted. No extra-axial fluid collection. Vascular: No hyperdense vessel or unexpected calcification. Skull: Normal. Negative for fracture or focal lesion. Sinuses/Orbits: No acute finding. Other: None IMPRESSION: No acute intracranial hemorrhage. Age-related atrophy and chronic microvascular ischemic disease. Electronically Signed   By: Anner Crete M.D.   On: 02/28/2016 21:55   Mr Brain Wo Contrast  Result Date: 02/29/2016 CLINICAL DATA:  Initial evaluation for worsening dysphagia. Evaluate for stroke. EXAM: MRI HEAD WITHOUT CONTRAST TECHNIQUE: Multiplanar, multiecho pulse sequences of the brain and surrounding structures were obtained without intravenous contrast. COMPARISON:  Prior CT from 02/28/2016. FINDINGS: Brain: Diffuse prominence of the CSF containing spaces compatible with generalized cerebral atrophy. Atrophy most probable  it within the anterior temporal lobes bilaterally. Patchy and confluent T2/FLAIR hyperintensity within the periventricular and deep white matter both cerebral hemispheres most compatible with chronic small vessel ischemic disease, advanced in nature. Chronic microvascular ischemic changes present within the pons as well. No abnormal foci of restricted diffusion to suggest acute or subacute ischemia. Gray-white matter differentiation maintained. No evidence for acute intracranial hemorrhage. No made of a subcentimeter focus of susceptibility artifact within the left occipital lobe, likely a small chronic microhemorrhage. No mass lesion, midline shift or mass effect. Ventricular prominence related to global parenchymal volume loss without hydrocephalus. No extra-axial fluid collection. Major dural sinuses are grossly patent. Pituitary gland normal. Vascular: Major intracranial vascular flow voids are maintained. Skull and upper cervical spine: Craniocervical junction within normal limits. Grade 1 anterolisthesis of C3 on C4 noted. No significant stenosis within the visualized upper cervical spine. Bone marrow signal intensity within normal limits. No scalp soft tissue abnormality.  Sinuses/Orbits: Globes and orbital soft tissues within normal limits. Patient is status post cataract extraction bilaterally. Paranasal sinuses are clear. Trace opacity noted within the right mastoid air cells. Left mastoid air cells clear. IMPRESSION: 1. No acute intracranial infarct or other process identified. 2. A advanced cerebral atrophy with chronic microvascular ischemic disease. Electronically Signed   By: Benjamin  McClintock M.D.   On: 02/29/2016 06:35   Dg Swallowing Func-speech Pathology  Result Date: 02/29/2016 Objective Swallowing Evaluation: Type of Study: MBS-Modified Barium Swallow Study Patient Details Name: Monique Guerra MRN: [id number] Date of Birth: 04/30/1921 Today's Date: 02/29/2016 Time: SLP Start Time (ACUTE  ONLY): 1530-SLP Stop Time (ACUTE ONLY): 1553 SLP Time Calculation (min) (ACUTE ONLY): 23 min Past Medical History: Past Medical History: Diagnosis Date . Acid reflux  . Anxiety  . Breast cancer (HCC)  . COPD (chronic obstructive pulmonary disease) (HCC)  . Environmental allergies  . Hyperthyroidism  . Macular degeneration  . Multiple thyroid nodules June 2014 . Pneumonia  . Vocal cord paralysis June 2014 Past Surgical History: Past Surgical History: Procedure Laterality Date . ADENOIDECTOMY   . HIP FRACTURE SURGERY   . MASTECTOMY   . TONSILLECTOMY   HPI: Monique Guerrais a 83 y.o.femalewith medical history significant of COPD, GERD, BRCA in distant past, vocal cord paralysis. Patient presented to the ED at [facility] with c/o worsening moderate to severe dysphagia with gradual onset over the past 3 weeks. She c/o SOB. Patient was just hospitalized for COPD exacerbation / bronchitis at [facility]. Since the hospital stay patients condition has gradually worsened and now she has difficulty getting around and handling secretions. Strong concern for aspiration. Son reports he must hold head to L side for her to be able to swallow and she is unable to eat.02/29/16 MRI showed no acute intracranial infarct. Noted advanced cerebral atrophy with chronic microvascular ischemic disease. Subjective: alert, follows basic commands Assessment / Plan / Recommendation CHL IP CLINICAL IMPRESSIONS 02/29/2016 Therapy Diagnosis Moderate oral phase dysphagia;Suspected primary esophageal dysphagia;Mild pharyngeal phase dysphagia Clinical Impression Patient presents with moderate oral dysphagia, mild pharyngeal dysphagia, and suspected primary esophageal dysphagia. Aspiration of thin liquids x1 during the swallow when taken with barium tablet due to premature spillage. No other aspiration or penetration observed. Oral phase noted for lingual pumping, reduced anterior to posterior transit with solids. Pharyngeal phase of swallowing was  remarkable for adequate tongue base retraction, adequate hyolaryngeal excursion and pharyngeal constriction. Epiglottic closure was complete. The cervical esophageal segment was remarkable for reduced UES relaxation, restriction of bolus flow and intermittent backflow into the pharynx. Adminstered pill partially crushed in puree; patient retained a portion of tablet in the valleculae which required multiple swallows of water to clear. Effective techniques include effortful swallow to clear pharyngeal residual, alternating solids and liquids. Recommend dysphagia 1 diet with thin liquids, medications well crushed in puree. Patient remains at mild risk for aspiration of backflow into the pharynx; as a result an esophageal assessment is highly recommended. Impact on safety and function Mild aspiration risk   CHL IP TREATMENT RECOMMENDATION 02/29/2016 Treatment Recommendations Therapy as outlined in treatment plan below   Prognosis 02/29/2016 Prognosis for Safe Diet Advancement Good Barriers to Reach Goals -- Barriers/Prognosis Comment -- CHL IP DIET RECOMMENDATION 02/29/2016 SLP Diet Recommendations Dysphagia 1 (Puree) solids;Thin liquid Liquid Administration via -- Medication Administration Crushed with puree Compensations Slow rate;Small sips/bites;Minimize environmental distractions;Follow solids with liquid;Effortful swallow Postural Changes Seated upright at 90 degrees   CHL IP OTHER RECOMMENDATIONS 02/29/2016 Recommended   Consults Consider GI evaluation;Consider esophageal assessment Oral Care Recommendations Oral care BID Other Recommendations --   CHL IP FOLLOW UP RECOMMENDATIONS 02/29/2016 Follow up Recommendations Other (comment)   CHL IP FREQUENCY AND DURATION 02/29/2016 Speech Therapy Frequency (ACUTE ONLY) min 2x/week Treatment Duration 2 weeks      CHL IP ORAL PHASE 02/29/2016 Oral Phase Impaired Oral - Pudding Teaspoon -- Oral - Pudding Cup -- Oral - Honey Teaspoon -- Oral - Honey Cup -- Oral - Nectar Teaspoon --  Oral - Nectar Cup -- Oral - Nectar Straw -- Oral - Thin Teaspoon WFL Oral - Thin Cup WFL Oral - Thin Straw WFL Oral - Puree Lingual pumping;Weak lingual manipulation Oral - Mech Soft -- Oral - Regular Impaired mastication;Lingual pumping Oral - Multi-Consistency Premature spillage;Weak lingual manipulation;Decreased bolus cohesion;Reduced posterior propulsion Oral - Pill Weak lingual manipulation;Lingual pumping;Piecemeal swallowing;Reduced posterior propulsion Oral Phase - Comment --  CHL IP PHARYNGEAL PHASE 02/29/2016 Pharyngeal Phase Impaired Pharyngeal- Pudding Teaspoon -- Pharyngeal -- Pharyngeal- Pudding Cup -- Pharyngeal -- Pharyngeal- Honey Teaspoon -- Pharyngeal -- Pharyngeal- Honey Cup -- Pharyngeal -- Pharyngeal- Nectar Teaspoon -- Pharyngeal -- Pharyngeal- Nectar Cup -- Pharyngeal -- Pharyngeal- Nectar Straw -- Pharyngeal -- Pharyngeal- Thin Teaspoon WFL Pharyngeal -- Pharyngeal- Thin Cup Pharyngeal residue - cp segment Pharyngeal -- Pharyngeal- Thin Straw Pharyngeal residue - cp segment Pharyngeal -- Pharyngeal- Puree Pharyngeal residue - cp segment;Pharyngeal residue - pyriform Pharyngeal -- Pharyngeal- Mechanical Soft -- Pharyngeal -- Pharyngeal- Regular Pharyngeal residue - cp segment;Pharyngeal residue - posterior pharnyx Pharyngeal -- Pharyngeal- Multi-consistency Penetration/Aspiration during swallow;Moderate aspiration;Delayed swallow initiation-pyriform sinuses Pharyngeal Material enters airway, passes BELOW cords and not ejected out despite cough attempt by patient Pharyngeal- Pill Pharyngeal residue - valleculae;Pharyngeal residue - cp segment Pharyngeal -- Pharyngeal Comment --  CHL IP CERVICAL ESOPHAGEAL PHASE 02/29/2016 Cervical Esophageal Phase Impaired Pudding Teaspoon -- Pudding Cup -- Honey Teaspoon -- Honey Cup -- Nectar Teaspoon -- Nectar Cup -- Nectar Straw -- Thin Teaspoon -- Thin Cup Esophageal backflow into cervical esophagus;Esophageal backflow into the pharynx;Reduced  cricopharyngeal relaxation Thin Straw -- Puree Esophageal backflow into the pharynx;Esophageal backflow into cervical esophagus;Reduced cricopharyngeal relaxation Mechanical Soft -- Regular Reduced cricopharyngeal relaxation Multi-consistency -- Pill -- Cervical Esophageal Comment -- Mary Beth Bardin, MS CF-SLP Speech-Language Pathologist [phone] No flowsheet data found. Mary E Bardin 02/29/2016, 6:03 PM                ELGERGAWY, DAWOOD M.D on 03/01/2016 at 1:30 PM  Between 7am to 7pm - Pager - [phone]  After 7pm go to [email or web address] - password [email or web address]  [facility] -  Office  [phone]     

## 2016-03-02 ENCOUNTER — Inpatient Hospital Stay (HOSPITAL_COMMUNITY): Payer: Medicare Other

## 2016-03-02 DIAGNOSIS — R1310 Dysphagia, unspecified: Secondary | ICD-10-CM

## 2016-03-02 LAB — BASIC METABOLIC PANEL WITH GFR
Anion gap: 5 (ref 5–15)
BUN: 13 mg/dL (ref 6–20)
CO2: 28 mmol/L (ref 22–32)
Calcium: 9.1 mg/dL (ref 8.9–10.3)
Chloride: 112 mmol/L — ABNORMAL HIGH (ref 101–111)
Creatinine, Ser: 0.73 mg/dL (ref 0.44–1.00)
GFR calc Af Amer: >60 mL/min
GFR calc non Af Amer: >60 mL/min
Glucose, Bld: 100 mg/dL — ABNORMAL HIGH (ref 65–99)
Potassium: 3.5 mmol/L (ref 3.5–5.1)
Sodium: 145 mmol/L (ref 135–145)

## 2016-03-02 LAB — CBC
HCT: 34.6 % — ABNORMAL LOW (ref 36.0–46.0)
Hemoglobin: 10.9 g/dL — ABNORMAL LOW (ref 12.0–15.0)
MCH: 28.8 pg (ref 26.0–34.0)
MCHC: 31.5 g/dL (ref 30.0–36.0)
MCV: 91.3 fL (ref 78.0–100.0)
PLATELETS: 252 10*3/uL (ref 150–400)
RBC: 3.79 MIL/uL — AB (ref 3.87–5.11)
RDW: 15.9 % — ABNORMAL HIGH (ref 11.5–15.5)
WBC: 12 10*3/uL — ABNORMAL HIGH (ref 4.0–10.5)

## 2016-03-02 MED ORDER — KCL IN DEXTROSE-NACL 40-5-0.9 MEQ/L-%-% IV SOLN
INTRAVENOUS | Status: DC
  Administered 2016-03-02: 19:00:00 via INTRAVENOUS
  Filled 2016-03-02 (×2): qty 1000

## 2016-03-02 MED ORDER — AMLODIPINE BESYLATE 5 MG PO TABS
5.0000 mg | ORAL_TABLET | Freq: Every day | ORAL | Status: DC
  Administered 2016-03-02 – 2016-03-03 (×2): 5 mg via ORAL
  Filled 2016-03-02 (×2): qty 1

## 2016-03-02 MED ORDER — ENSURE ENLIVE PO LIQD
237.0000 mL | Freq: Two times a day (BID) | ORAL | Status: DC
  Administered 2016-03-03: 237 mL via ORAL

## 2016-03-02 NOTE — Progress Notes (Signed)
OT Cancellation Note  Patient Details Name: AVIONNA KARNOWSKI MRN: [id number] DOB: 08/19/1921   Cancelled Treatment:    Reason Eval/Treat Not Completed: Patient declined, no reason specified OT attempting to work with patient. Pt very angry and reports no one is listening and she just wants to die. Pt oriented to location as [facility], reports month as February and the year as 2017. Pt with min cues able to correct error. Pt offered to request Palliative or Pastoral care. Pt slapping at therapist hand and telling therapist to leave. Son arriving. Pt telling son "just take me home! I am not doing anything Ill just lay here and die!!"  Vonita Moss   OTR/L Pager: [phone] Office: [phone] .  03/02/2016, 1:04 PM

## 2016-03-02 NOTE — Progress Notes (Signed)
PROGRESS NOTE    Monique FOGLESONG  QBH:[id number] DOB: 1921-02-04 DOA: 02/28/2016 PCP: Mayra Neer, MD   Brief Narrative: 83 y.o.femalewith medical history significant of COPD, BRCA in distant past, vocal cord paralysis. Patient presents to the ED at [facility] with c/o worsening moderate to severe dysphagia with gradual onset over the past 3 weeks. She c/o SOB. Patient was just hospitalized for COPD exacerbation / bronchitis at [facility]. Workup significant for right middle lobe infiltrate.  Assessment & Plan:  # Healthcare acquired pneumonia - Patient presents with cough, with known dysphagia, chest x-ray significant for right middle lobe infiltrate. - Most likely aspiration pneumonia, on IV zosyn, continue. - Follow blood cultures, sputum cultures and strep pneumoniae antigen  # Dysphagia - Seems to be progressive problem, significantly worsened over last 3 weeks, evaluated by swallow team and gastroenterologist. GI recommended barium swallow evaluation. Patient is refusing the test at this time. I consulted palliative care evaluation. Continue dysphagia diet.  # Acute respiratory failure with hypoxia - No significant wheezing, continue with oral prednisone, nebs as needed. - Continue with pneumonia treatment  # Questionable left-sided weakness on admission and dysphagia - Physical exam essentially normal, neuro consult appreciated, MRI brain with no evidence of acute CVA, her symptoms related to his weakness from pneumonia, and known chronic vocal cord dysfunction.  # Hypertension  - Blood pressure elevated. I will start low-dose amlodipine. Continue to monitor.  # Hyperthyroidism - Continue with methimazole  # Hypokalemia - Serum potassium level improved. Encourage oral intake.  # Possible delirium and physical deconditioning: Tried to reorient the patient. Discussed with the patient's son reported that patient is requiring higher level of care at home and unable to provide  by the family members. He was asking if patient qualifies for SNF. I ordered PT, OT and social worker consult. Also consulted palliative care because patient is refusing any taste and patient said she would rather die. The patient is already DNR/DNI. I discussed with the patient's son regarding the idea of hospice care.  Principal Problem:   HCAP (healthcare-associated pneumonia) Active Problems:   Left-sided weakness   Right middle lobe pneumonia ([place])   Acute respiratory failure with hypoxia (HCC)  DVT prophylaxis: Lovenox subcutaneous Code Status: DO NOT RESUSCITATE Family Communication: Discussed with the patient's son in detail. Disposition Plan: Likely discharge to SNF and possibly with hospice in 1-2 days.    Consultants:   Palliative care and social worker  Procedures: Barium swallow evaluation pending Antimicrobials: IV Zosyn.  Subjective: Patient was seen and examined at bedside. Patient was mildly agitated and asked to cancel everything. She did not want any tests. Denied nausea vomiting chest pain or shortness of breath. Review of systems is unreliable.  Objective: Vitals:   03/01/16 2300 03/01/16 2318 03/02/16 0618 03/02/16 1423  BP: (!) 144/72  (!) 161/94 (!) 170/85  Pulse: (!) 107 (!) 104 90 92  Resp: '16 16 18 19  '$ Temp: 98.7 F (37.1 C)  97.8 F (36.6 C) 98.3 F (36.8 C)  TempSrc: Oral  Oral Oral  SpO2: 96% 96% 96% 93%  Weight:      Height:        Intake/Output Summary (Last 24 hours) at 03/02/16 1452 Last data filed at 03/02/16 0952  Gross per 24 hour  Intake               60 ml  Output              800 ml  Net             -  740 ml   Filed Weights   02/28/16 1912  Weight: 52.2 kg (115 lb)    Examination:  General exam:Elderly female lying on bed, not in distress Respiratory system: Bibasal crackles. Respiratory effort normal. No wheezing or crackle Cardiovascular system: S1 & S2 heard, RRR.  No pedal edema. Gastrointestinal system: Abdomen is  nondistended, soft and nontender. Normal bowel sounds heard. Central nervous system: Alert and awake and agitated.. Extremities: Symmetric 5 x 5 power. Skin: No rashes, lesions or ulcers Psychiatry: Judgement and insight appear impaired.      Data Reviewed: I have personally reviewed following labs and imaging studies  CBC:  Recent Labs Lab 02/28/16 2154 03/01/16 0518 03/02/16 0528  WBC 10.7* 11.9* 12.0*  NEUTROABS 8.1*  --   --   HGB 12.7 11.2* 10.9*  HCT 38.5 35.6* 34.6*  MCV 90.8 90.6 91.3  PLT 288 263 563   Basic Metabolic Panel:  Recent Labs Lab 02/28/16 2154 03/01/16 0518 03/02/16 0528  NA 140 144 145  K 3.2* 3.2* 3.5  CL 101 104 112*  CO2 '30 27 28  '$ GLUCOSE 116* 114* 100*  BUN '16 16 13  '$ CREATININE 0.62 0.75 0.73  CALCIUM 9.1 9.0 9.1   GFR: Estimated Creatinine Clearance: 33.9 mL/min (by C-G formula based on SCr of 0.73 mg/dL). Liver Function Tests:  Recent Labs Lab 02/28/16 2154  AST 17  ALT 14  ALKPHOS 91  BILITOT 0.4  PROT 5.9*  ALBUMIN 3.2*   No results for input(s): LIPASE, AMYLASE in the last 168 hours. No results for input(s): AMMONIA in the last 168 hours. Coagulation Profile:  Recent Labs Lab 02/28/16 2154  INR 0.98   Cardiac Enzymes:  Recent Labs Lab 02/28/16 2154  TROPONINI <0.03   BNP (last 3 results) No results for input(s): PROBNP in the last 8760 hours. HbA1C: No results for input(s): HGBA1C in the last 72 hours. CBG: No results for input(s): GLUCAP in the last 168 hours. Lipid Profile: No results for input(s): CHOL, HDL, LDLCALC, TRIG, CHOLHDL, LDLDIRECT in the last 72 hours. Thyroid Function Tests: No results for input(s): TSH, T4TOTAL, FREET4, T3FREE, THYROIDAB in the last 72 hours. Anemia Panel: No results for input(s): VITAMINB12, FOLATE, FERRITIN, TIBC, IRON, RETICCTPCT in the last 72 hours. Sepsis Labs:  Recent Labs Lab 02/28/16 2246  LATICACIDVEN 0.82    Recent Results (from the past 240 hour(s))    Culture, blood (Routine X 2) w Reflex to ID Panel     Status: None (Preliminary result)   Collection Time: 02/28/16 10:25 PM  Result Value Ref Range Status   Specimen Description BLOOD RIGHT ARM  Final   Special Requests BOTTLES DRAWN AEROBIC AND ANAEROBIC 10ML EACH  Final   Culture   Final    NO GROWTH 1 DAY Performed at [facility], [place] [address], [place], [place] [address]    Report Status PENDING  Incomplete  Culture, blood (Routine X 2) w Reflex to ID Panel     Status: None (Preliminary result)   Collection Time: 02/28/16 10:40 PM  Result Value Ref Range Status   Specimen Description BLOOD RIGHT ARM  Final   Special Requests IN PEDIATRIC BOTTLE 2ML  Final   Culture   Final    NO GROWTH 1 DAY Performed at [facility], [place] [address], [place], [place] [address]    Report Status PENDING  Incomplete         Radiology Studies: Dg Swallowing Func-speech Pathology  Result  Date: 02/29/2016 Objective Swallowing Evaluation: Type of Study: MBS-Modified Barium Swallow Study Patient Details Name: Monique Guerra MRN: [id number] Date of Birth: November 25, 1921 Today's Date: 02/29/2016 Time: SLP Start Time (ACUTE ONLY): 1530-SLP Stop Time (ACUTE ONLY): 1553 SLP Time Calculation (min) (ACUTE ONLY): 23 min Past Medical History: Past Medical History: Diagnosis Date . Acid reflux  . Anxiety  . Breast cancer ([place])  . COPD (chronic obstructive pulmonary disease) ([place])  . Environmental allergies  . Hyperthyroidism  . Macular degeneration  . Multiple thyroid nodules June 2014 . Pneumonia  . Vocal cord paralysis June 2014 Past Surgical History: Past Surgical History: Procedure Laterality Date . ADENOIDECTOMY   . HIP FRACTURE SURGERY   . MASTECTOMY   . TONSILLECTOMY   HPI: DENESIA DONELAN a 83 y.o.femalewith medical history significant of COPD, GERD, BRCA in distant past, vocal cord paralysis. Patient presented to the ED at [facility] with c/o worsening moderate to severe dysphagia with gradual  onset over the past 3 weeks. She c/o SOB. Patient was just hospitalized for COPD exacerbation / bronchitis at [facility]. Since the hospital stay patients condition has gradually worsened and now she has difficulty getting around and handling secretions. Strong concern for aspiration. Son reports he must hold head to L side for her to be able to swallow and she is unable to eat.02/29/16 MRI showed no acute intracranial infarct. Noted advanced cerebral atrophy with chronic microvascular ischemic disease. Subjective: alert, follows basic commands Assessment / Plan / Recommendation CHL IP CLINICAL IMPRESSIONS 02/29/2016 Therapy Diagnosis Moderate oral phase dysphagia;Suspected primary esophageal dysphagia;Mild pharyngeal phase dysphagia Clinical Impression Patient presents with moderate oral dysphagia, mild pharyngeal dysphagia, and suspected primary esophageal dysphagia. Aspiration of thin liquids x1 during the swallow when taken with barium tablet due to premature spillage. No other aspiration or penetration observed. Oral phase noted for lingual pumping, reduced anterior to posterior transit with solids. Pharyngeal phase of swallowing was remarkable for adequate tongue base retraction, adequate hyolaryngeal excursion and pharyngeal constriction. Epiglottic closure was complete. The cervical esophageal segment was remarkable for reduced UES relaxation, restriction of bolus flow and intermittent backflow into the pharynx. Adminstered pill partially crushed in puree; patient retained a portion of tablet in the valleculae which required multiple swallows of water to clear. Effective techniques include effortful swallow to clear pharyngeal residual, alternating solids and liquids. Recommend dysphagia 1 diet with thin liquids, medications well crushed in puree. Patient remains at mild risk for aspiration of backflow into the pharynx; as a result an esophageal assessment is highly recommended. Impact on safety and function  Mild aspiration risk   CHL IP TREATMENT RECOMMENDATION 02/29/2016 Treatment Recommendations Therapy as outlined in treatment plan below   Prognosis 02/29/2016 Prognosis for Safe Diet Advancement Good Barriers to Reach Goals -- Barriers/Prognosis Comment -- CHL IP DIET RECOMMENDATION 02/29/2016 SLP Diet Recommendations Dysphagia 1 (Puree) solids;Thin liquid Liquid Administration via -- Medication Administration Crushed with puree Compensations Slow rate;Small sips/bites;Minimize environmental distractions;Follow solids with liquid;Effortful swallow Postural Changes Seated upright at 90 degrees   CHL IP OTHER RECOMMENDATIONS 02/29/2016 Recommended Consults Consider GI evaluation;Consider esophageal assessment Oral Care Recommendations Oral care BID Other Recommendations --   CHL IP FOLLOW UP RECOMMENDATIONS 02/29/2016 Follow up Recommendations Other (comment)   CHL IP FREQUENCY AND DURATION 02/29/2016 Speech Therapy Frequency (ACUTE ONLY) min 2x/week Treatment Duration 2 weeks      CHL IP ORAL PHASE 02/29/2016 Oral Phase Impaired Oral - Pudding Teaspoon -- Oral - Pudding Cup -- Oral - Honey Teaspoon -- Oral -  Honey Cup -- Oral - Nectar Teaspoon -- Oral - Nectar Cup -- Oral - Nectar Straw -- Oral - Thin Teaspoon WFL Oral - Thin Cup WFL Oral - Thin Straw WFL Oral - Puree Lingual pumping;Weak lingual manipulation Oral - Mech Soft -- Oral - Regular Impaired mastication;Lingual pumping Oral - Multi-Consistency Premature spillage;Weak lingual manipulation;Decreased bolus cohesion;Reduced posterior propulsion Oral - Pill Weak lingual manipulation;Lingual pumping;Piecemeal swallowing;Reduced posterior propulsion Oral Phase - Comment --  CHL IP PHARYNGEAL PHASE 02/29/2016 Pharyngeal Phase Impaired Pharyngeal- Pudding Teaspoon -- Pharyngeal -- Pharyngeal- Pudding Cup -- Pharyngeal -- Pharyngeal- Honey Teaspoon -- Pharyngeal -- Pharyngeal- Honey Cup -- Pharyngeal -- Pharyngeal- Nectar Teaspoon -- Pharyngeal -- Pharyngeal- Nectar Cup --  Pharyngeal -- Pharyngeal- Nectar Straw -- Pharyngeal -- Pharyngeal- Thin Teaspoon WFL Pharyngeal -- Pharyngeal- Thin Cup Pharyngeal residue - cp segment Pharyngeal -- Pharyngeal- Thin Straw Pharyngeal residue - cp segment Pharyngeal -- Pharyngeal- Puree Pharyngeal residue - cp segment;Pharyngeal residue - pyriform Pharyngeal -- Pharyngeal- Mechanical Soft -- Pharyngeal -- Pharyngeal- Regular Pharyngeal residue - cp segment;Pharyngeal residue - posterior pharnyx Pharyngeal -- Pharyngeal- Multi-consistency Penetration/Aspiration during swallow;Moderate aspiration;Delayed swallow initiation-pyriform sinuses Pharyngeal Material enters airway, passes BELOW cords and not ejected out despite cough attempt by patient Pharyngeal- Pill Pharyngeal residue - valleculae;Pharyngeal residue - cp segment Pharyngeal -- Pharyngeal Comment --  CHL IP CERVICAL ESOPHAGEAL PHASE 02/29/2016 Cervical Esophageal Phase Impaired Pudding Teaspoon -- Pudding Cup -- Honey Teaspoon -- Honey Cup -- Nectar Teaspoon -- Nectar Cup -- Nectar Straw -- Thin Teaspoon -- Thin Cup Esophageal backflow into cervical esophagus;Esophageal backflow into the pharynx;Reduced cricopharyngeal relaxation Thin Straw -- Puree Esophageal backflow into the pharynx;Esophageal backflow into cervical esophagus;Reduced cricopharyngeal relaxation Mechanical Soft -- Regular Reduced cricopharyngeal relaxation Multi-consistency -- Pill -- Cervical Esophageal Comment -- Deneise Lever, MS CF-SLP Speech-Language Pathologist [phone] No flowsheet data found. Aliene Altes 02/29/2016, 6:03 PM                   Scheduled Meds: . acidophilus  2 capsule Oral Daily  . aspirin EC  81 mg Oral Daily  . budesonide  2 mg Nebulization Daily  . citalopram  10 mg Oral Daily  . enoxaparin (LOVENOX) injection  40 mg Subcutaneous Q24H  . feeding supplement (ENSURE ENLIVE)  237 mL Oral BID BM  . methimazole  2.5 mg Oral Once per day on Sun Sat  . methimazole  5 mg Oral Once per day  on Mon Tue Wed Thu Fri  . multivitamin with minerals  1 tablet Oral Daily  . piperacillin-tazobactam (ZOSYN)  IV  3.375 g Intravenous Q8H  . pravastatin  40 mg Oral q1800  . predniSONE  40 mg Oral Q breakfast   Continuous Infusions: . sodium chloride 50 mL/hr at 02/29/16 1626     LOS: 2 days    Jamond Neels Tanna Furry, MD [facility] Pager [phone]  If 7PM-7AM, please contact night-coverage [email or web address] Password [email or web address] 03/02/2016, 2:52 PM

## 2016-03-02 NOTE — Progress Notes (Signed)
Speech Language Pathology Treatment: Dysphagia  Patient Details Name: Monique Guerra MRN: [id number] DOB: 03/27/1921 Today's Date: 03/02/2016 Time: 0569-7948 SLP Time Calculation (min) (ACUTE ONLY): 18 min  Assessment / Plan / Recommendation Clinical Impression  Monique Guerra was asleep when SLP entered room; when awoken she was cooperative, appeared somewhat confused and anxious, and accepted POs with feeding assistance. Observed with thin liquids via cup/straw and exhibited s/s of aspiration on some trials, such as immediate throat clearing. Pt did not have difficulty with first two trials of puree, however, she expectorated the third subsequent trial and exclaimed that she was "choking" (likely experienced backflow from tight upper esophageal segment). Recommend NPO at this time; nursing mentioned possible Palliative Care consult, which is reasonable. Suspect esophagus may need dilating, however pt refused barium esophagram this morning and uncertain if pt/family would want to proceed with an EGD. If Palliative Care is consulted and family chooses comfort feeds, recommend full liquids as this may transit through cervical esophagus with less effort. ST will f/u.    HPI HPI: Monique Guerra a 83 y.o.femalewith medical history significant of COPD, GERD, BRCA in distant past, vocal cord paralysis. Patient presented to the ED at [facility] with c/o worsening moderate to severe dysphagia with gradual onset over the past 3 weeks. She c/o SOB. Patient was just hospitalized for COPD exacerbation / bronchitis at [facility]. Since the hospital stay patients condition has gradually worsened and now she has difficulty getting around and handling secretions. Strong concern for aspiration. Son reports he must hold head to L side for her to be able to swallow and she is unable to eat.02/29/16 MRI showed no acute intracranial infarct. Noted advanced cerebral atrophy with chronic microvascular ischemic disease.       SLP Plan  Continue with current plan of care     Recommendations  Diet recommendations: NPO Liquids provided via: Straw;Cup Medication Administration: Via alternative means Supervision: Full supervision/cueing for compensatory strategies Compensations: Slow rate;Small sips/bites Postural Changes and/or Swallow Maneuvers: Seated upright 90 degrees;Upright 30-60 min after meal                Oral Care Recommendations: Oral care QID Follow up Recommendations: Other (comment) (TBD) Plan: Continue with current plan of care       [place] , Lydia 03/02/2016, 2:24 PM

## 2016-03-02 NOTE — Progress Notes (Signed)
RT called MD to change dose of Pulmicort, he said to give the home dose. Called the pharmacy where patient has medications filled ([facility] [phone]) and she hasn't had this medication filled there. Will pass on to oncoming RT to verify in the morning.

## 2016-03-03 DIAGNOSIS — Z515 Encounter for palliative care: Secondary | ICD-10-CM

## 2016-03-03 DIAGNOSIS — Z66 Do not resuscitate: Secondary | ICD-10-CM

## 2016-03-03 LAB — CBC
HCT: 37.7 % (ref 36.0–46.0)
Hemoglobin: 11.9 g/dL — ABNORMAL LOW (ref 12.0–15.0)
MCH: 29 pg (ref 26.0–34.0)
MCHC: 31.6 g/dL (ref 30.0–36.0)
MCV: 92 fL (ref 78.0–100.0)
PLATELETS: 222 10*3/uL (ref 150–400)
RBC: 4.1 MIL/uL (ref 3.87–5.11)
RDW: 15.9 % — AB (ref 11.5–15.5)
WBC: 10.5 10*3/uL (ref 4.0–10.5)

## 2016-03-03 LAB — BASIC METABOLIC PANEL
Anion gap: 8 (ref 5–15)
BUN: 10 mg/dL (ref 6–20)
CALCIUM: 9 mg/dL (ref 8.9–10.3)
CO2: 29 mmol/L (ref 22–32)
CREATININE: 0.64 mg/dL (ref 0.44–1.00)
Chloride: 107 mmol/L (ref 101–111)
GFR calc Af Amer: >60 mL/min (ref >60)
GLUCOSE: 95 mg/dL (ref 65–99)
Potassium: 3.1 mmol/L — ABNORMAL LOW (ref 3.5–5.1)
Sodium: 144 mmol/L (ref 135–145)

## 2016-03-03 MED ORDER — BUDESONIDE 0.5 MG/2ML IN SUSP
0.5000 mg | Freq: Every day | RESPIRATORY_TRACT | Status: DC
  Administered 2016-03-03: 0.5 mg via RESPIRATORY_TRACT
  Filled 2016-03-03: qty 2

## 2016-03-03 MED ORDER — POTASSIUM CHLORIDE 2 MEQ/ML IV SOLN
30.0000 meq | Freq: Once | INTRAVENOUS | Status: DC
  Administered 2016-03-03: 30 meq via INTRAVENOUS
  Filled 2016-03-03 (×2): qty 15

## 2016-03-03 MED ORDER — MORPHINE SULFATE (CONCENTRATE) 10 MG/0.5ML PO SOLN
5.0000 mg | ORAL | Status: DC | PRN
  Administered 2016-03-04: 5 mg via ORAL
  Filled 2016-03-03: qty 0.5

## 2016-03-03 NOTE — Consult Note (Signed)
Consultation Note Date: 03/03/2016   Patient Name: Monique Guerra  DOB: 01/08/1922  MRN: [id number]  Age / Sex: 83 y.o., female  PCP: Mayra Neer, MD Referring Physician: Rosita Fire, MD  Reason for Consultation: Establishing goals of care, Non pain symptom management, Pain control and Psychosocial/spiritual support  HPI/Patient Profile: 83 y.o. female  admitted on 02/28/2016 with past medical history significant of COPD, BRCA in distant past, vocal cord paralysis.  Patient was just hospitalized for COPD exacerbation / bronchitis at [facility].    Continued physical and functional decline.   Patient presents to the ED at [facility] with c/o worsening moderate to severe dysphagia with gradual onset over the past 3 weeks.  She c/o SOB.    CXR suspicious for RML PNA.  Work up also suspicious for LUE weakness and numbness.  CT head negative.  Initiated treatments for stabilization and swallow studies but today patient makes decision for full comfort and all diagnostics cancelled  Patient faces EOL decisions and anticipatory care needs.   Clinical Assessment and Goals of Care:  This NP Wadie Lessen reviewed medical records, received report from team, assessed the patient and then meet at the patient's bedside along with her son  to discuss diagnosis, prognosis, GOC, EOL wishes disposition and options.  A detailed discussion was had today regarding advanced directives.  Concepts specific to code status, artifical feeding and hydration, continued IV antibiotics and rehospitalization was had.  The difference between a aggressive medical intervention path  and a palliative comfort care path for this patient at this time was had.    Values and goals of care important to patient and family were attempted to be elicited.  Patient is very clear about "allow a natural death".  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  "I know I'm dying and I don't want this to drag on"    MOST form  Completed, reflects full comfort.  Concept of Hospice and Palliative Care were discussed  Natural trajectory and expectations at EOL were discussed.  Questions and concerns addressed.   Family encouraged to call with questions or concerns.  PMT will continue to support holistically.   Patient is her own main decision maker with the support of her only son/James Ray    SUMMARY OF RECOMMENDATIONS    Code Status/Advance Care Planning:  DNR   Symptom Management:   Dysphagia: Diet as tolerated with known risk of aspiration  Pain/Dyspnea: Roxanol 5 mg po/sl every 1 hr prn  Urinary incontinence: Place foley for comfort measures  Palliative Prophylaxis:   Aspiration, Bowel Regimen, Delirium Protocol, Frequent Pain Assessment and Oral Care  Additional Recommendations (Limitations, Scope, Preferences):  Avoid Hospitalization, Full Comfort Care, Minimize Medications, No Artificial Feeding, No Blood Transfusions, No Diagnostics, No Glucose Monitoring, No IV Antibiotics, No IV Fluids and No Lab Draws  Psycho-social/Spiritual:   Desire for further Chaplaincy support:no  Additional Recommendations: Education on Hospice and Grief/Bereavement  Support  Prognosis:   < 3 months, antri biotics stopped today, no life prolonging measures  Discharge Planning: [place] with Hospice      Primary Diagnoses: Present on Admission: . HCAP (healthcare-associated pneumonia) . Right middle lobe pneumonia ([place]) . Acute respiratory failure with hypoxia ([place])   I have reviewed the medical record, interviewed the patient and family, and examined the patient. The following aspects are pertinent.  Past Medical History:  Diagnosis Date  . Acid reflux   . Anxiety   . Breast cancer ([place])   . COPD (chronic obstructive pulmonary disease) ([place])   .  Environmental allergies   . Hyperthyroidism   . Macular degeneration   . Multiple thyroid nodules June 2014  . Pneumonia   . Vocal cord paralysis June 2014   Social History   Social History  . Marital status: Widowed    Spouse name: N/A  . Number of children: N/A  . Years of education: N/A   Social History Main Topics  . Smoking status: Former Smoker    Quit date: 02/03/1988  . Smokeless tobacco: Never Used  . Alcohol use No  . Drug use: No  . Sexual activity: Not Asked   Other Topics Concern  . None   Social History Narrative  . None   No family history on file. Scheduled Meds: . budesonide  0.5 mg Nebulization Daily  . feeding supplement (ENSURE ENLIVE)  237 mL Oral BID BM  . methimazole  2.5 mg Oral Once per day on Sun Sat  . methimazole  5 mg Oral Once per day on Mon Tue Wed Thu Fri   Continuous Infusions: . dextrose 5 % and 0.9 % NaCl with KCl 40 mEq/L 75 mL/hr at 03/02/16 1852   PRN Meds:.albuterol, ipratropium-albuterol Medications Prior to Admission:  Prior to Admission medications   Medication Sig Start Date End Date Taking? Authorizing Provider  albuterol (PROVENTIL HFA;VENTOLIN HFA) 108 (90 Base) MCG/ACT inhaler Inhale 2 puffs into the lungs 2 (two) times daily as needed for wheezing or shortness of breath.   Yes Historical Provider, MD  aspirin EC 81 MG tablet Take 81 mg by mouth daily.   Yes Historical Provider, MD  budesonide (PULMICORT) 1 MG/2ML nebulizer solution Take 2 mg by nebulization daily.   Yes Historical Provider, MD  citalopram (CELEXA) 10 MG tablet Take 10 mg by mouth daily.   Yes Historical Provider, MD  ENSURE (ENSURE) Take 237 mLs by mouth 3 (three) times daily between meals.   Yes Historical Provider, MD  famotidine (PEPCID) 20 MG tablet Take 20 mg by mouth at bedtime as needed for heartburn or indigestion.   Yes Historical Provider, MD  fexofenadine (ALLEGRA) 180 MG tablet Take 180 mg by mouth daily as needed for allergies or rhinitis.    Yes Historical Provider, MD  fluticasone (FLONASE) 50 MCG/ACT nasal spray Place 1 spray into both nostrils daily as needed for allergies or rhinitis.   Yes Historical Provider, MD  guaiFENesin (MUCINEX) 600 MG 12 hr tablet Take 600 mg by mouth every 12 (twelve) hours as needed for cough or to loosen phlegm.   Yes Historical Provider, MD  ipratropium-albuterol (DUONEB) 0.5-2.5 (3) MG/3ML SOLN Take 3 mLs by nebulization every 8 (eight) hours as needed (shortness of breath/ wheezing).   Yes Historical Provider, MD  LORazepam (ATIVAN) 0.5 MG tablet Take 0.125 mg by mouth daily as needed for anxiety. 1/4 tablet - 0.125 mg   Yes Historical Provider, MD  methimazole (  TAPAZOLE) 5 MG tablet Take 2.5-5 mg by mouth See admin instructions. Take 1 tablet (5 mg) by mouth on Monday thru Friday mornings, take 1/2 tablet (2.5 mg) on Saturday and 'Sunday mornings   Yes Historical Provider, MD  Multiple Vitamin (MULTIVITAMIN WITH MINERALS) TABS tablet Take 1 tablet by mouth daily.   Yes Historical Provider, MD  OXYGEN Inhale 2 L into the lungs continuous.   Yes Historical Provider, MD  Polyethyl Glycol-Propyl Glycol (SYSTANE) 0.4-0.3 % SOLN Place 1 drop into both eyes 2 (two) times daily.   Yes Historical Provider, MD   Allergies  Allergen Reactions  . Metoprolol Other (See Comments)    Elevated blood pressure - reported by [facility] June 2017  . Other     Cough med-tachycardia  . Prednisone Other (See Comments)    Weakened immune system.- reported by [facility] June 2017  . Protonix [Pantoprazole Sodium] Other (See Comments)    Caused elevated blood pressure  . Zoloft [Sertraline Hcl] Other (See Comments)    50 mg dose caused sleepiness  . Aminophylline Palpitations  . Esomeprazole Magnesium Nausea And Vomiting    Reported by [facility] June 2015  . Omeprazole Magnesium Nausea And Vomiting    Reported by [facility] November  2015   Review of Systems  Constitutional: Positive for appetite change and fatigue.       -dys  Genitourinary:       -patient reports urinary incontinence and inability to enjoy any liquid intake 2/2 incontinence  Neurological: Positive for weakness.    Physical Exam  Constitutional: She is oriented to person, place, and time. She appears ill. Nasal cannula in place.  -frail, elderly  HENT:  Mouth/Throat: Mucous membranes are dry.  Cardiovascular: Normal rate, regular rhythm and normal heart sounds.   Pulmonary/Chest: She has decreased breath sounds in the right lower field and the left lower field.  Abdominal: Soft. Bowel sounds are normal.  Musculoskeletal:  generalized weakness and atrophy  Neurological: She is alert and oriented to person, place, and time. She displays atrophy.  Skin: Skin is warm and dry.    Vital Signs: BP 136/72 (BP Location: Right Arm)   Pulse 78   Temp 98.8 F (37.1 C) (Oral)   Resp 16   Ht 5\' 1" (1.549 m)   Wt 52.2 kg (115 lb)   SpO2 96%   BMI 21.73 kg/m  Pain Assessment: No/denies pain   Pain Score: 0-No pain   SpO2: SpO2: 96 % O2 Device:SpO2: 96 % O2 Flow Rate: .O2 Flow Rate (L/min): 3 L/min  IO: Intake/output summary:  Intake/Output Summary (Last 24 hours) at 03/03/16 1241 Last data filed at 03/03/16 1022  Gross per 24 hour  Intake                0 ml  Output             14'$ 50 ml  Net            -1450 ml    LBM: Last BM Date: 02/27/16 Baseline Weight: Weight: 52.2 kg (115 lb) Most recent weight: Weight: 52.2 kg (115 lb)      Palliative Assessment/Data: 30 % at best   Flowsheet Rows   Flowsheet Row Most Recent Value  Intake Tab  Referral Department  Hospitalist  Unit at Time of Referral  Med/Surg Unit  Palliative Care Primary Diagnosis  Pulmonary  Date Notified  03/02/16  Palliative Care Type  New Palliative care  Reason for referral  Clarify Goals of Care  Date of Admission  02/28/16  # of days IP prior to Palliative  referral  3  Clinical Assessment  Psychosocial & Spiritual Assessment  Palliative Care Outcomes      Time In: 1200 Time Out: 1315 Time Total: 75 min Greater than 50%  of this time was spent counseling and coordinating care related to the above assessment and plan.  Signed by: Wadie Lessen, NP   Please contact Palliative Medicine Team phone at [phone] for questions and concerns.  For individual provider: See Shea Evans

## 2016-03-03 NOTE — Progress Notes (Signed)
PROGRESS NOTE    DANIAL SISLEY  PZW:[id number] DOB: Nov 30, 1921 DOA: 02/28/2016 PCP: Mayra Neer, MD   Brief Narrative: 83 y.o.femalewith medical history significant of COPD, BRCA in distant past, vocal cord paralysis. Patient presents to the ED at [facility] with c/o worsening moderate to severe dysphagia with gradual onset over the past 3 weeks. She c/o SOB. Patient was just hospitalized for COPD exacerbation / bronchitis at [facility]. Workup significant for right middle lobe infiltrate.  Assessment & Plan:  # Healthcare acquired pneumonia - Patient presents with cough, with known dysphagia, chest x-ray significant for right middle lobe infiltrate. - Most likely aspiration pneumonia, treated with IV zosyn.  However on further discussion with the palliative care team today. Patient is full comfort and does not like any tests or antibiotics. Also does not like barium swallow test. I discontinued NPO order and ordered dysphagia diet. Social worker consulted for SNF with hospice care. Palliative care consult appreciated.  # Dysphagia - Seems to be progressive problem, significantly worsened over last 3 weeks, evaluated by swallow team and gastroenterologist. GI recommended barium swallow evaluation. Pt is full comfort care, no further testing.   # Acute respiratory failure with hypoxia - No significant wheezing, continue with oral prednisone, nebs as needed. -supportive care.  # Questionable left-sided weakness on admission and dysphagia - Physical exam essentially normal, neuro consult appreciated, MRI brain with no evidence of acute CVA, her symptoms related to his weakness from pneumonia, and known chronic vocal cord dysfunction.  # Hypertension  - continue low-dose amlodipine. Continue to monitor.  # Hyperthyroidism - Continue with methimazole  # Hypokalemia -repleted KCL IV. Encourage oral intake. No further lab testing.  # Possible delirium and physical deconditioning:  Patient with improved mentally status today. She was alert and oriented and understand current clinical condition. She does not want to pursue any further testing and wants comfort care. Also discussed with the palliative care service.  Principal Problem:   HCAP (healthcare-associated pneumonia) Active Problems:   Left-sided weakness   Right middle lobe pneumonia ([place])   Acute respiratory failure with hypoxia ([facility])   Dysphagia  DVT prophylaxis: comfort care Code Status: DO NOT RESUSCITATE Family Communication: Discussed with the patient's son on 1/29.  Disposition Plan: Likely discharge to SNF with hospice in 1-2 days.    Consultants:   Palliative care and social worker  Procedures: Swallow Antimicrobials: IV Zosyn.  Subjective: Patient was seen and examined at bedside. Patient was alert and awake and improved mentally status today. She was sitting on chair comfortable. Denied chest pain, shortness of breath, nausea vomiting. Objective: Vitals:   03/02/16 2059 03/03/16 0450 03/03/16 1059 03/03/16 1151  BP: (!) 151/80 (!) 145/71 136/72   Pulse: 88 86 78   Resp: 18 16    Temp: 99.5 F (37.5 C) 98.8 F (37.1 C)    TempSrc: Oral Oral    SpO2: 96% 97% 95% 96%  Weight:      Height:        Intake/Output Summary (Last 24 hours) at 03/03/16 1257 Last data filed at 03/03/16 1022  Gross per 24 hour  Intake                0 ml  Output             1450 ml  Net            -1450 ml   Filed Weights   02/28/16 1912  Weight: 52.2 kg (115 lb)  Examination:  General exam:Elderly female sitting on chair, not in distress. Respiratory system: Clear bilaterally. Respiratory effort normal. No wheezing or crackle Cardiovascular system: Regular rate rhythm, S1-S2 normal. No pedal edema. Gastrointestinal system: Abdomen is nondistended, soft and nontender. Normal bowel sounds heard. Central nervous system: Alert awake and following commands. Extremities: Symmetric 5 x 5  power. Skin: No rashes, lesions or ulcers Psychiatry: Judgement and insight appear is age appropriate.      Data Reviewed: I have personally reviewed following labs and imaging studies  CBC:  Recent Labs Lab 02/28/16 2154 03/01/16 0518 03/02/16 0528 03/03/16 0616  WBC 10.7* 11.9* 12.0* 10.5  NEUTROABS 8.1*  --   --   --   HGB 12.7 11.2* 10.9* 11.9*  HCT 38.5 35.6* 34.6* 37.7  MCV 90.8 90.6 91.3 92.0  PLT 288 263 252 376   Basic Metabolic Panel:  Recent Labs Lab 02/28/16 2154 03/01/16 0518 03/02/16 0528 03/03/16 0616  NA 140 144 145 144  K 3.2* 3.2* 3.5 3.1*  CL 101 104 112* 107  CO2 _0 GLUCOSE 116* 114* 100* 95  BUN _1 CREATININE 0.62 0.75 0.73 0.64  CALCIUM 9.1 9.0 9.1 9.0   GFR: Estimated Creatinine Clearance: 33.9 mL/min (by C-G formula based on SCr of 0.64 mg/dL). Liver Function Tests:  Recent Labs Lab 02/28/16 2154  AST 17  ALT 14  ALKPHOS 91  BILITOT 0.4  PROT 5.9*  ALBUMIN 3.2*   No results for input(s): LIPASE, AMYLASE in the last 168 hours. No results for input(s): AMMONIA in the last 168 hours. Coagulation Profile:  Recent Labs Lab 02/28/16 2154  INR 0.98   Cardiac Enzymes:  Recent Labs Lab 02/28/16 2154  TROPONINI <0.03   BNP (last 3 results) No results for input(s): PROBNP in the last 8760 hours. HbA1C: No results for input(s): HGBA1C in the last 72 hours. CBG: No results for input(s): GLUCAP in the last 168 hours. Lipid Profile: No results for input(s): CHOL, HDL, LDLCALC, TRIG, CHOLHDL, LDLDIRECT in the last 72 hours. Thyroid Function Tests: No results for input(s): TSH, T4TOTAL, FREET4, T3FREE, THYROIDAB in the last 72 hours. Anemia Panel: No results for input(s): VITAMINB12, FOLATE, FERRITIN, TIBC, IRON, RETICCTPCT in the last 72 hours. Sepsis Labs:  Recent Labs Lab 02/28/16 2246  LATICACIDVEN 0.82    Recent Results (from the past 240 hour(s))  Culture, blood (Routine X 2) w Reflex to ID  Panel     Status: None (Preliminary result)   Collection Time: 02/28/16 10:25 PM  Result Value Ref Range Status   Specimen Description BLOOD RIGHT ARM  Final   Special Requests BOTTLES DRAWN AEROBIC AND ANAEROBIC 10ML EACH  Final   Culture   Final    NO GROWTH 2 DAYS Performed at [facility], [place] [address] [place], [place] [address]    Report Status PENDING  Incomplete  Culture, blood (Routine X 2) w Reflex to ID Panel     Status: None (Preliminary result)   Collection Time: 02/28/16 10:40 PM  Result Value Ref Range Status   Specimen Description BLOOD RIGHT ARM  Final   Special Requests IN PEDIATRIC BOTTLE 2ML  Final   Culture   Final    NO GROWTH 2 DAYS Performed at [facility], [place] [address], [place], [place] [address]    Report Status PENDING  Incomplete         Radiology Studies: No results found.  Scheduled Meds: . budesonide  0.5 mg Nebulization Daily  . feeding supplement (ENSURE ENLIVE)  237 mL Oral BID BM  . methimazole  2.5 mg Oral Once per day on Sun Sat  . methimazole  5 mg Oral Once per day on Mon Tue Wed Thu Fri   Continuous Infusions: . dextrose 5 % and 0.9 % NaCl with KCl 40 mEq/L 75 mL/hr at 03/02/16 1852     LOS: 3 days    Shaneque Merkle Tanna Furry, MD [facility] Pager [phone]  If 7PM-7AM, please contact night-coverage [email or web address] Password [email or web address] 03/03/2016, 12:57 PM

## 2016-03-03 NOTE — Evaluation (Signed)
Physical Therapy Evaluation Patient Details Name: Monique Guerra MRN: [id number] DOB: [birth year] Today's Date: 03/03/2016   History of Present Illness  Monique Guerra a 83 y.o.femalewith medical history significant of COPD, BRCA in distant past, vocal cord paralysis. Patient presents to the ED at [facility] with c/o worsening moderate to severe dysphagia with gradual onset over the past 3 weeks. She c/o SOB. Patient was just hospitalized for COPD exacerbation / bronchitis at [facility].  Clinical Impression  Pt indep PTA and lived alone but now requires assist for all mobility and ADLs. Pt agreeable to SNF upon d/c for maximal functional recovery for safe transition home alone.    Follow Up Recommendations SNF    Equipment Recommendations  Rolling walker with 5" wheels    Recommendations for Other Services Rehab consult     Precautions / Restrictions Precautions Precautions: Fall Precaution Comments: Pt at increased risk of falls secondary to weakness.  Restrictions Weight Bearing Restrictions: No      Mobility  Bed Mobility Overal bed mobility: Needs Assistance Bed Mobility: Supine to Sit     Supine to sit: Supervision     General bed mobility comments: increased time, use of bed rail  Transfers Overall transfer level: Needs assistance Equipment used: Rolling walker (2 wheeled) Transfers: Sit to/from Stand Sit to Stand: Min assist         General transfer comment: v/c's for hand placement, pt shaky, minA via HHA to steady pt  Ambulation/Gait Ambulation/Gait assistance: Min assist Ambulation Distance (Feet): 25 Feet (to the door, rest, and back) Assistive device: Rolling walker (2 wheeled) Gait Pattern/deviations: Step-to pattern;Decreased stride length;Wide base of support Gait velocity: slow Gait velocity interpretation: Below normal speed for age/gender General Gait Details: once she got to the door pt reports " i feel like i'm going to faint." pt denies  seeing spots but became anxious. pt sat down in chair, BP taken 134/65. pt with very short shuffled steps even with RW  Stairs            Wheelchair Mobility    Modified Rankin (Stroke Patients Only)       Balance Overall balance assessment: Needs assistance Sitting-balance support: Feet supported Sitting balance-Leahy Scale: Fair     Standing balance support: Bilateral upper extremity supported Standing balance-Leahy Scale: Poor                               Pertinent Vitals/Pain Pain Assessment: 0-10 Pain Score: 0-No pain    Home Living Family/patient expects to be discharged to:: Skilled nursing facility Living Arrangements: Alone             Home Equipment: Walker - 4 wheels;Cane - single point      Prior Function Level of Independence: Independent         Comments: pt was driving and doing grocery shopping     Hand Dominance        Extremity/Trunk Assessment   Upper Extremity Assessment Upper Extremity Assessment: Generalized weakness    Lower Extremity Assessment Lower Extremity Assessment: Generalized weakness    Cervical / Trunk Assessment Cervical / Trunk Assessment: Normal  Communication   Communication: No difficulties  Cognition Arousal/Alertness: Awake/alert Behavior During Therapy: WFL for tasks assessed/performed Overall Cognitive Status: Within Functional Limits for tasks assessed                      General Comments  Exercises     Assessment/Plan    PT Assessment Patient needs continued PT services  PT Problem List Decreased strength;Decreased activity tolerance;Decreased balance;Decreased mobility          PT Treatment Interventions DME instruction;Gait training;Patient/family education    PT Goals (Current goals can be found in the Care Plan section)  Acute Rehab PT Goals Patient Stated Goal: Pt would like to return to independent.  PT Goal Formulation: With patient Time For Goal  Achievement: 03/17/16 Potential to Achieve Goals: Good    Frequency Min 3X/week   Barriers to discharge Decreased caregiver support lives alone    Co-evaluation               End of Session Equipment Utilized During Treatment: Gait belt;Oxygen (2LO2 via [place]) Activity Tolerance: Patient tolerated treatment well;Patient limited by fatigue Patient left: in chair;with call bell/phone within reach Nurse Communication: Mobility status         Time: 0919-8022 PT Time Calculation (min) (ACUTE ONLY): 21 min   Charges:   PT Evaluation $PT Eval Moderate Complexity: 1 Procedure     PT G Codes:        Janelle Culton M Chaka Boyson 03/03/2016, 10:58 AM   Kittie Plater, PT, DPT Pager #: [phone] Office #: [phone]

## 2016-03-03 NOTE — NC FL2 (Signed)
  [place] LEVEL OF CARE SCREENING TOOL     IDENTIFICATION  Patient Name: Monique Guerra Birthdate: 01-27-1922 Sex: female Admission Date (Current Location): 02/28/2016  [facility] and [place] Number:  Herbalist and Address:  [facility] [place], [place] [address], [place], [place] [address]      Provider Number: [id number]  Attending Physician Name and Address:  Rosita Fire, MD  Relative Name and Phone Number:       Current Level of Care: Hospital Recommended Level of Care: [place] Prior Approval Number:    Date Approved/Denied:   PASRR Number: [id number]  Discharge Plan: Home    Current Diagnoses: Patient Active Problem List   Diagnosis Date Noted  . Palliative care by specialist 03/03/2016  . DNR (do not resuscitate) 03/03/2016  . Dysphagia   . Healthcare-associated pneumonia 02/29/2016  . Left-sided weakness 02/29/2016  . Right middle lobe pneumonia ([place]) 02/29/2016  . Acute respiratory failure with hypoxia ([place]) 02/29/2016    Orientation RESPIRATION BLADDER Height & Weight     Self, Time, Situation, Place  O2 (3L [place]) Continent Weight: 115 lb (52.2 kg) Height:  5\' 1"  (154.9 cm)  BEHAVIORAL SYMPTOMS/MOOD NEUROLOGICAL BOWEL NUTRITION STATUS      Continent Diet (see DC summary)  AMBULATORY STATUS COMMUNICATION OF NEEDS Skin   Limited Assist Verbally Normal                       Personal Care Assistance Level of Assistance  Bathing, Dressing Bathing Assistance: Limited assistance   Dressing Assistance: Limited assistance     Functional Limitations Info             SPECIAL CARE FACTORS FREQUENCY  PT (By licensed PT), OT (By licensed OT)     PT Frequency: 5/wk OT Frequency: 5/wk            Contractures      Additional Factors Info  Code Status, Allergies Code Status Info: DNR Allergies Info: Metoprolol, Other, Prednisone, Protonix Pantoprazole Sodium, Zoloft Sertraline  Hcl, Aminophylline, Esomeprazole Magnesium, Omeprazole Magnesium           Current Medications (03/03/2016):  This is the current hospital active medication list Current Facility-Administered Medications  Medication Dose Route Frequency Provider Last Rate Last Dose  . albuterol (PROVENTIL) (2.5 MG/3ML) 0.083% nebulizer solution 2.5 mg  2.5 mg Nebulization Q2H PRN Etta Quill, DO   2.5 mg at 02/29/16 0339  . ipratropium-albuterol (DUONEB) 0.5-2.5 (3) MG/3ML nebulizer solution 3 mL  3 mL Nebulization Q8H PRN Albertine Patricia, MD      . methimazole (TAPAZOLE) tablet 2.5 mg  2.5 mg Oral Once per day on Sun Sat Albertine Patricia, MD   2.5 mg at 03/01/16 [id number]  . methimazole (TAPAZOLE) tablet 5 mg  5 mg Oral Once per day on Mon Tue Wed Thu Fri Albertine Patricia, MD   5 mg at 03/03/16 1111  . morphine CONCENTRATE 10 MG/0.5ML oral solution 5 mg  5 mg Oral Q1H PRN Knox Royalty, NP         Discharge Medications: Please see discharge summary for a list of discharge medications.  Relevant Imaging Results:  Relevant Lab Results:   Additional Information SS#: [id number]  Jorge Ny, LCSW

## 2016-03-03 NOTE — Clinical Social Work Note (Signed)
Clinical Social Work Assessment  Patient Details  Name: Monique Guerra MRN: [id number] Date of Birth: August 20, 1921  Date of referral:  03/03/16               Reason for consult:  Facility Placement                Permission sought to share information with:  Chartered certified accountant granted to share information::  Yes, Verbal Permission Granted  Name::     Kerin Perna::  SNF  Relationship::  son  Contact Information:     Housing/Transportation Living arrangements for the past 2 months:  [place] of Information:  Adult Children, Patient Patient Interpreter Needed:  None Criminal Activity/Legal Involvement Pertinent to Current Situation/Hospitalization:  No - Comment as needed Significant Relationships:  Adult Children, Other Family Members Lives with:    Do you feel safe going back to the place where you live?  No Need for family participation in patient care:  No (Coment)  Care giving concerns:  Pt terminally ill and now requiring too much care to return home.   Social Worker assessment / plan:  Palliative discussed SNF with hospice following- explained that SNF would need to be paid for privately while comfort care.  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  Emotional Assessment Appearance:  Appears stated age Attitude/Demeanor/Rapport:  Lethargic Affect (typically observed):  Appropriate, Accepting Orientation:  Oriented to Self, Oriented to Place, Oriented to  Time, Oriented to Situation Alcohol / Substance use:  Not Applicable Psych  involvement (Current and /or in the community):  No (Comment)  Discharge Needs  Concerns to be addressed:  Care Coordination Readmission within the last 30 days:  No Current discharge risk:  Physical Impairment, Terminally ill Barriers to Discharge:  Continued Medical Work up   Jorge Ny, LCSW 03/03/2016, 3:05 PM

## 2016-03-03 NOTE — Progress Notes (Signed)
OT Cancellation Note  Patient Details Name: Monique Guerra MRN: [id number] DOB: Jun 07, 1921   Cancelled Treatment:    Reason Eval/Treat Not Completed: Other (comment). Note pt going comfort care and plan is to DC to SNF. If OT eval needed, please call number below, Thank you.   [place], OT/L  [id number] 03/03/2016 03/03/2016, 3:20 PM

## 2016-03-04 NOTE — Discharge Summary (Signed)
Physician Discharge Summary  Monique Guerra [id number] DOB: 1921/03/31 DOA: 02/28/2016  PCP: Lupita Raider, MD  Admit date: 02/28/2016 Discharge date: 03/04/2016  Admitted From:Home Disposition:SNF with hospice  Recommendations for Outpatient Follow-up:  1. Follow up with hospice.  Home Health:hospice care Equipment/Devices: Discharge Condition:hospice CODE STATUS:comfort care/DNR Diet recommendation: as tolerated  Brief/Interim Summary:83 y.o.femalewith medical history significant of COPD, BRCA in distant past, vocal cord paralysis. Patient presents to the ED at [facility] with c/o worsening moderate to severe dysphagia with gradual onset over the past 3 weeks. She c/o SOB. Patient was just hospitalized for COPD exacerbation / bronchitis at [facility]. Workup significant for right middle lobe infiltrate.  # Dysphagia - Seems to be progressive problem, significantly worsened over last 3 weeks, evaluated by swallow team and gastroenterologist. GI recommended barium swallow evaluation. Patient declined any procedure. I discussed with the patient she is alert and oriented and can understand situation. She does not want to pursue any testing or treatment at this time. Evaluated by palliative care service. Patient is DO NOT RESUSCITATE and comfort care only. Discussed with the social worker for hospice care. -Patient does not want to be readmitted to the hospital and wanted to focus on comfort care only. I discussed with the patient's son at bedside today.   # Healthcare acquired pneumonia - Patient presents with cough, with known dysphagia, chest x-ray significant for right middle lobe infiltrate. - Most likely aspiration pneumonia, treated with IV zosyn. Later on the antibiotics discontinued because of comfort care and hospice care.  # Acute respiratory failure with hypoxia -supportive care.  # Questionableleft-sided weakness on admissionand dysphagia - Physical exam essentially  normal, neuro consult appreciated, MRI brain with no evidence of acute CVA, her symptoms related to his weakness from pneumonia, and known chronic vocal cord dysfunction.  # Hypertension  - Started on low-dose amlodipine however later discontinued for hospice care  # Hyperthyroidism - Continue with methimazole  # Hypokalemia -Replete potassium chloride in the hospital. Encourage oral intake..  # Possible delirium and physical deconditioning: Palliative care appreciated. Continue supportive care.  Patient will be discharged to SNF with hospice care. Focus on comfort care. Patient does not like to be readmitted to the hospital. Patient and family understand clearly.   Discharge Diagnoses:  Principal Problem:   Healthcare-associated pneumonia Active Problems:   Left-sided weakness   Right middle lobe pneumonia (HCC)   Acute respiratory failure with hypoxia (HCC)   Dysphagia   Palliative care by specialist   DNR (do not resuscitate)    Discharge Instructions  Discharge Instructions    Amb Referral to Palliative Care    Complete by:  As directed    Diet general    Complete by:  As directed    Increase activity slowly    Complete by:  As directed      Allergies as of 03/04/2016      Reactions   Metoprolol Other (See Comments)   Elevated blood pressure - reported by [facility] and [facility] June 2017   Other    Cough med-tachycardia   Prednisone Other (See Comments)   Weakened immune system.- reported by [facility] and [facility] June 2017   Protonix [pantoprazole Sodium] Other (See Comments)   Caused elevated blood pressure   Zoloft [sertraline Hcl] Other (See Comments)   50 mg dose caused sleepiness   Aminophylline Palpitations   Esomeprazole Magnesium Nausea And Vomiting   Reported by [facility] and [facility] June  2015   Omeprazole Magnesium Nausea And Vomiting   Reported by [facility] and [facility] November 2015      Medication  List    STOP taking these medications   aspirin EC 81 MG tablet   budesonide 1 MG/2ML nebulizer solution Commonly known as:  PULMICORT   fexofenadine 180 MG tablet Commonly known as:  ALLEGRA   LORazepam 0.5 MG tablet Commonly known as:  ATIVAN   multivitamin with minerals Tabs tablet     TAKE these medications   albuterol 108 (90 Base) MCG/ACT inhaler Commonly known as:  PROVENTIL HFA;VENTOLIN HFA Inhale 2 puffs into the lungs 2 (two) times daily as needed for wheezing or shortness of breath.   citalopram 10 MG tablet Commonly known as:  CELEXA Take 10 mg by mouth daily.   ENSURE Take 237 mLs by mouth 3 (three) times daily between meals.   famotidine 20 MG tablet Commonly known as:  PEPCID Take 20 mg by mouth at bedtime as needed for heartburn or indigestion.   fluticasone 50 MCG/ACT nasal spray Commonly known as:  FLONASE Place 1 spray into both nostrils daily as needed for allergies or rhinitis.   guaiFENesin 600 MG 12 hr tablet Commonly known as:  MUCINEX Take 600 mg by mouth every 12 (twelve) hours as needed for cough or to loosen phlegm.   ipratropium-albuterol 0.5-2.5 (3) MG/3ML Soln Commonly known as:  DUONEB Take 3 mLs by nebulization every 8 (eight) hours as needed (shortness of breath/ wheezing).   methimazole 5 MG tablet Commonly known as:  TAPAZOLE Take 2.5-5 mg by mouth See admin instructions. Take 1 tablet (5 mg) by mouth on Monday thru Friday mornings, take 1/2 tablet (2.5 mg) on Saturday and Sunday mornings   OXYGEN Inhale 2 L into the lungs continuous.   SYSTANE 0.4-0.3 % Soln Generic drug:  Polyethyl Glycol-Propyl Glycol Place 1 drop into both eyes 2 (two) times daily.      Follow-up Information    SHAW,KIMBERLEE, MD. Schedule an appointment as soon as possible for a visit in 1 week(s).   Specialty:  Family Medicine Contact information: [address] [place] [address] [phone]          Allergies   Allergen Reactions  . Metoprolol Other (See Comments)    Elevated blood pressure - reported by [facility] and [facility] June 2017  . Other     Cough med-tachycardia  . Prednisone Other (See Comments)    Weakened immune system.- reported by [facility] and [facility] June 2017  . Protonix [Pantoprazole Sodium] Other (See Comments)    Caused elevated blood pressure  . Zoloft [Sertraline Hcl] Other (See Comments)    50 mg dose caused sleepiness  . Aminophylline Palpitations  . Esomeprazole Magnesium Nausea And Vomiting    Reported by [facility] and [facility] June 2015  . Omeprazole Magnesium Nausea And Vomiting    Reported by [facility] and [facility] November 2015    Consultations: Gastroenterologist and palliative care  Procedures/Studies: Swallowing eval   Subjective: Patient was seen and examined at bedside. Patient has cough but denies shortness of breath, chest pain, nausea or vomiting. She understand and wants to be comfort care only and hospice care. Patient's son at bedside.   Discharge Exam: Vitals:   03/03/16 2148 03/04/16 0526  BP: 123/62 (!) 141/66  Pulse: (!) 101 93  Resp: 18 18  Temp: 99 F (37.2  C) 98 F (36.7 C)   Vitals:   03/03/16 1740 03/03/16 1745 03/03/16 2148 03/04/16 0526  BP:  136/72 123/62 (!) 141/66  Pulse: 98 97 (!) 101 93  Resp:  '18 18 18  '$ Temp:  98 F (36.7 C) 99 F (37.2 C) 98 F (36.7 C)  TempSrc:  Oral    SpO2:  96% 96% 95%  Weight:      Height:        General:Elderly female lying on bed, not in distress  Cardiovascular: RRR, S1/S2 +, no rubs, no gallops Respiratory: Clear bilateral, no wheezing Abdominal: Soft, NT, ND, bowel sounds + Extremities: no edema, no cyanosis Neurologic: Alert, awake, following commands.   The results of significant diagnostics from this hospitalization (including imaging, microbiology, ancillary and laboratory) are listed below for reference.     Microbiology: Recent  Results (from the past 240 hour(s))  Culture, blood (Routine X 2) w Reflex to ID Panel     Status: None (Preliminary result)   Collection Time: 02/28/16 10:25 PM  Result Value Ref Range Status   Specimen Description BLOOD RIGHT ARM  Final   Special Requests BOTTLES DRAWN AEROBIC AND ANAEROBIC 10ML EACH  Final   Culture   Final    NO GROWTH 3 DAYS Performed at [facility], [address] [place], [place] [address]    Report Status PENDING  Incomplete  Culture, blood (Routine X 2) w Reflex to ID Panel     Status: None (Preliminary result)   Collection Time: 02/28/16 10:40 PM  Result Value Ref Range Status   Specimen Description BLOOD RIGHT ARM  Final   Special Requests IN PEDIATRIC BOTTLE 2ML  Final   Culture   Final    NO GROWTH 3 DAYS Performed at [facility], [place] [address], [place], [place] [address]    Report Status PENDING  Incomplete     Labs: BNP (last 3 results)  Recent Labs  02/28/16 2154  BNP 69.6   Basic Metabolic Panel:  Recent Labs Lab 02/28/16 2154 03/01/16 0518 03/02/16 0528 03/03/16 0616  NA 140 144 145 144  K 3.2* 3.2* 3.5 3.1*  CL 101 104 112* 107  CO2 '30 27 28 29  '$ GLUCOSE 116* 114* 100* 95  BUN '16 16 13 10  '$ CREATININE 0.62 0.75 0.73 0.64  CALCIUM 9.1 9.0 9.1 9.0   Liver Function Tests:  Recent Labs Lab 02/28/16 2154  AST 17  ALT 14  ALKPHOS 91  BILITOT 0.4  PROT 5.9*  ALBUMIN 3.2*   No results for input(s): LIPASE, AMYLASE in the last 168 hours. No results for input(s): AMMONIA in the last 168 hours. CBC:  Recent Labs Lab 02/28/16 2154 03/01/16 0518 03/02/16 0528 03/03/16 0616  WBC 10.7* 11.9* 12.0* 10.5  NEUTROABS 8.1*  --   --   --   HGB 12.7 11.2* 10.9* 11.9*  HCT 38.5 35.6* 34.6* 37.7  MCV 90.8 90.6 91.3 92.0  PLT 288 263 252 222   Cardiac Enzymes:  Recent Labs Lab 02/28/16 2154  TROPONINI <0.03   BNP: Invalid input(s): POCBNP CBG: No results for input(s): GLUCAP in the last 168  hours. D-Dimer No results for input(s): DDIMER in the last 72 hours. Hgb A1c No results for input(s): HGBA1C in the last 72 hours. Lipid Profile No results for input(s): CHOL, HDL, LDLCALC, TRIG, CHOLHDL, LDLDIRECT in the last 72 hours. Thyroid function studies No results for input(s): TSH, T4TOTAL, T3FREE, THYROIDAB in the  last 72 hours.  Invalid input(s): FREET3 Anemia work up No results for input(s): VITAMINB12, FOLATE, FERRITIN, TIBC, IRON, RETICCTPCT in the last 72 hours. Urinalysis    Component Value Date/Time   COLORURINE YELLOW 02/28/2016 2219   APPEARANCEUR CLEAR 02/28/2016 2219   LABSPEC 1.015 02/28/2016 2219   PHURINE 6.5 02/28/2016 2219   GLUCOSEU NEGATIVE 02/28/2016 2219   HGBUR NEGATIVE 02/28/2016 2219   [place] NEGATIVE 02/28/2016 2219   KETONESUR NEGATIVE 02/28/2016 2219   PROTEINUR NEGATIVE 02/28/2016 2219   UROBILINOGEN 0.2 08/20/2014 1215   NITRITE NEGATIVE 02/28/2016 2219   LEUKOCYTESUR NEGATIVE 02/28/2016 2219   Sepsis Labs Invalid input(s): PROCALCITONIN,  WBC,  LACTICIDVEN Microbiology Recent Results (from the past 240 hour(s))  Culture, blood (Routine X 2) w Reflex to ID Panel     Status: None (Preliminary result)   Collection Time: 02/28/16 10:25 PM  Result Value Ref Range Status   Specimen Description BLOOD RIGHT ARM  Final   Special Requests BOTTLES DRAWN AEROBIC AND ANAEROBIC 10ML EACH  Final   Culture   Final    NO GROWTH 3 DAYS Performed at [facility], [place] [address] [place], [place] [address]    Report Status PENDING  Incomplete  Culture, blood (Routine X 2) w Reflex to ID Panel     Status: None (Preliminary result)   Collection Time: 02/28/16 10:40 PM  Result Value Ref Range Status   Specimen Description BLOOD RIGHT ARM  Final   Special Requests IN PEDIATRIC BOTTLE 2ML  Final   Culture   Final    NO GROWTH 3 DAYS Performed at [facility], [place] [address], [place], [place] [address]    Report Status PENDING   Incomplete     Time coordinating discharge: 27 minutes  SIGNED:   Rosita Fire, MD  [facility] 03/04/2016, 11:53 AM  If 7PM-7AM, please contact night-coverage [email or web address] Password [email or web address]

## 2016-03-04 NOTE — Progress Notes (Signed)
This NP f/u at beside with patient and her son as f/u to [facility].    Both are comfortable with decision to focus on comfort and dignity.  Discussed natural trajectory and expectation at EOL.  Plan is discharged to SNF with hospice.  Questions and concerns addressed.  Family encouraged to call with questions or concerns   Wadie Lessen NP  Palliative Medicine Team Team Phone # [phone] Pager [phone]   Time in 1100  Time out 1120 Greater than 50%  of this time was spent counseling and coordinating care related to the above assessment and plan.

## 2016-03-04 NOTE — Progress Notes (Signed)
Morphine wasted with Junie Panning RN.

## 2016-03-04 NOTE — Progress Notes (Signed)
Report called to [facility] at facility

## 2016-03-04 NOTE — Progress Notes (Addendum)
Patient will discharge to [facility] Anticipated discharge date: 1/31 Family notified: Jeneen Rinks- son Transportation by [facility]- called at 3:20pm Report #: [phone] Rm 2  [place] signing off.  Jorge Ny, LCSW Clinical Social Worker [phone]

## 2016-03-04 NOTE — Care Management Important Message (Signed)
Important Message  Patient Details  Name: Monique Guerra MRN: [id number] Date of Birth: 1922-01-26   Medicare Important Message Given:  Yes    Nathen May 03/04/2016, 12:06 PM

## 2016-03-04 NOTE — Progress Notes (Signed)
[name] to be D/C'd Skilled nursing facility per MD order.  Discussed with the patient and all questions fully answered.  VSS, Skin clean, dry and intact without evidence of skin break down, no evidence of skin tears noted. IV catheter discontinued intact. Site without signs and symptoms of complications. Dressing and pressure applied.  An After Visit Summary was printed and given to the patient. Patient received prescription.  D/c education completed with patient/family including follow up instructions, medication list, d/c activities limitations if indicated, with other d/c instructions as indicated by MD - patient able to verbalize understanding, all questions fully answered.   Patient instructed to return to ED, call 911, or call MD for any changes in condition.    Monique Guerra 03/04/2016 5:51 PM

## 2016-03-04 NOTE — Clinical Social Work Placement (Signed)
   CLINICAL SOCIAL WORK PLACEMENT  NOTE  Date:  03/04/2016  Patient Details  Name: Monique Guerra MRN: [id number] Date of Birth: 1921-11-01  Clinical Social Work is seeking post-discharge placement for this patient at the [place] level of care (*CSW will initial, date and re-position this form in  chart as items are completed):  Yes   Patient/family provided with [place] Work Department's list of facilities offering this level of care within the geographic area requested by the patient (or if unable, by the patient's family).  Yes   Patient/family informed of their freedom to choose among providers that offer the needed level of care, that participate in Medicare, Medicaid or managed care program needed by the patient, have an available bed and are willing to accept the patient.  Yes   Patient/family informed of [facility]'s ownership interest in [facility] and [facility], as well as of the fact that they are under no obligation to receive care at these facilities.  PASRR submitted to EDS on 03/03/16     PASRR number received on 03/03/16     Existing PASRR number confirmed on       FL2 transmitted to all facilities in geographic area requested by pt/family on 03/03/16     FL2 transmitted to all facilities within larger geographic area on       Patient informed that his/her managed care company has contracts with or will negotiate with certain facilities, including the following:            Patient/family informed of bed offers received.  Patient chooses bed at [facility]     Physician recommends and patient chooses bed at      Patient to be transferred to [facility] on 03/04/16.  Patient to be transferred to facility by ptar     Patient family notified on 03/04/16 of transfer.  Name of family member notified:  Jeneen Rinks     PHYSICIAN Please sign FL2     Additional Comment:     _______________________________________________ Jorge Ny, LCSW 03/04/2016, 3:24 PM

## 2016-03-05 LAB — CULTURE, BLOOD (ROUTINE X 2)
Culture: NO GROWTH
Culture: NO GROWTH

## 2016-03-06 DIAGNOSIS — J690 Pneumonitis due to inhalation of food and vomit: Secondary | ICD-10-CM | POA: Diagnosis not present

## 2016-03-06 DIAGNOSIS — I10 Essential (primary) hypertension: Secondary | ICD-10-CM | POA: Diagnosis not present

## 2016-03-06 DIAGNOSIS — F418 Other specified anxiety disorders: Secondary | ICD-10-CM | POA: Diagnosis not present

## 2016-03-06 DIAGNOSIS — J984 Other disorders of lung: Secondary | ICD-10-CM | POA: Diagnosis not present

## 2016-03-06 DIAGNOSIS — Z87891 Personal history of nicotine dependence: Secondary | ICD-10-CM | POA: Diagnosis not present

## 2016-03-06 DIAGNOSIS — J3801 Paralysis of vocal cords and larynx, unilateral: Secondary | ICD-10-CM | POA: Diagnosis not present

## 2016-03-06 DIAGNOSIS — J449 Chronic obstructive pulmonary disease, unspecified: Secondary | ICD-10-CM | POA: Diagnosis not present

## 2016-03-06 DIAGNOSIS — R1319 Other dysphagia: Secondary | ICD-10-CM | POA: Diagnosis not present

## 2016-03-06 DIAGNOSIS — Z466 Encounter for fitting and adjustment of urinary device: Secondary | ICD-10-CM | POA: Diagnosis not present

## 2016-03-06 DIAGNOSIS — K219 Gastro-esophageal reflux disease without esophagitis: Secondary | ICD-10-CM | POA: Diagnosis not present

## 2016-03-06 DIAGNOSIS — H3530 Unspecified macular degeneration: Secondary | ICD-10-CM | POA: Diagnosis not present

## 2016-03-06 DIAGNOSIS — J9611 Chronic respiratory failure with hypoxia: Secondary | ICD-10-CM | POA: Diagnosis not present

## 2016-03-06 DIAGNOSIS — R1310 Dysphagia, unspecified: Secondary | ICD-10-CM | POA: Diagnosis not present

## 2016-03-08 DIAGNOSIS — J690 Pneumonitis due to inhalation of food and vomit: Secondary | ICD-10-CM | POA: Diagnosis not present

## 2016-03-08 DIAGNOSIS — H3530 Unspecified macular degeneration: Secondary | ICD-10-CM | POA: Diagnosis not present

## 2016-03-08 DIAGNOSIS — J449 Chronic obstructive pulmonary disease, unspecified: Secondary | ICD-10-CM | POA: Diagnosis not present

## 2016-03-08 DIAGNOSIS — R1310 Dysphagia, unspecified: Secondary | ICD-10-CM | POA: Diagnosis not present

## 2016-03-08 DIAGNOSIS — J3801 Paralysis of vocal cords and larynx, unilateral: Secondary | ICD-10-CM | POA: Diagnosis not present

## 2016-03-08 DIAGNOSIS — J9611 Chronic respiratory failure with hypoxia: Secondary | ICD-10-CM | POA: Diagnosis not present

## 2016-03-09 DIAGNOSIS — J690 Pneumonitis due to inhalation of food and vomit: Secondary | ICD-10-CM | POA: Diagnosis not present

## 2016-03-09 DIAGNOSIS — J9611 Chronic respiratory failure with hypoxia: Secondary | ICD-10-CM | POA: Diagnosis not present

## 2016-03-09 DIAGNOSIS — J3801 Paralysis of vocal cords and larynx, unilateral: Secondary | ICD-10-CM | POA: Diagnosis not present

## 2016-03-09 DIAGNOSIS — H3530 Unspecified macular degeneration: Secondary | ICD-10-CM | POA: Diagnosis not present

## 2016-03-09 DIAGNOSIS — R1310 Dysphagia, unspecified: Secondary | ICD-10-CM | POA: Diagnosis not present

## 2016-03-09 DIAGNOSIS — J449 Chronic obstructive pulmonary disease, unspecified: Secondary | ICD-10-CM | POA: Diagnosis not present

## 2016-03-10 DIAGNOSIS — J449 Chronic obstructive pulmonary disease, unspecified: Secondary | ICD-10-CM | POA: Diagnosis not present

## 2016-03-10 DIAGNOSIS — R1310 Dysphagia, unspecified: Secondary | ICD-10-CM | POA: Diagnosis not present

## 2016-03-10 DIAGNOSIS — H3530 Unspecified macular degeneration: Secondary | ICD-10-CM | POA: Diagnosis not present

## 2016-03-10 DIAGNOSIS — J3801 Paralysis of vocal cords and larynx, unilateral: Secondary | ICD-10-CM | POA: Diagnosis not present

## 2016-03-10 DIAGNOSIS — J9611 Chronic respiratory failure with hypoxia: Secondary | ICD-10-CM | POA: Diagnosis not present

## 2016-03-10 DIAGNOSIS — J690 Pneumonitis due to inhalation of food and vomit: Secondary | ICD-10-CM | POA: Diagnosis not present

## 2016-03-11 DIAGNOSIS — H3530 Unspecified macular degeneration: Secondary | ICD-10-CM | POA: Diagnosis not present

## 2016-03-11 DIAGNOSIS — J690 Pneumonitis due to inhalation of food and vomit: Secondary | ICD-10-CM | POA: Diagnosis not present

## 2016-03-11 DIAGNOSIS — J449 Chronic obstructive pulmonary disease, unspecified: Secondary | ICD-10-CM | POA: Diagnosis not present

## 2016-03-11 DIAGNOSIS — R1310 Dysphagia, unspecified: Secondary | ICD-10-CM | POA: Diagnosis not present

## 2016-03-11 DIAGNOSIS — J9611 Chronic respiratory failure with hypoxia: Secondary | ICD-10-CM | POA: Diagnosis not present

## 2016-03-11 DIAGNOSIS — J3801 Paralysis of vocal cords and larynx, unilateral: Secondary | ICD-10-CM | POA: Diagnosis not present

## 2016-03-12 DIAGNOSIS — J3801 Paralysis of vocal cords and larynx, unilateral: Secondary | ICD-10-CM | POA: Diagnosis not present

## 2016-03-12 DIAGNOSIS — R1310 Dysphagia, unspecified: Secondary | ICD-10-CM | POA: Diagnosis not present

## 2016-03-12 DIAGNOSIS — H3530 Unspecified macular degeneration: Secondary | ICD-10-CM | POA: Diagnosis not present

## 2016-03-12 DIAGNOSIS — J449 Chronic obstructive pulmonary disease, unspecified: Secondary | ICD-10-CM | POA: Diagnosis not present

## 2016-03-12 DIAGNOSIS — J690 Pneumonitis due to inhalation of food and vomit: Secondary | ICD-10-CM | POA: Diagnosis not present

## 2016-03-12 DIAGNOSIS — J9611 Chronic respiratory failure with hypoxia: Secondary | ICD-10-CM | POA: Diagnosis not present

## 2016-03-13 DIAGNOSIS — J9611 Chronic respiratory failure with hypoxia: Secondary | ICD-10-CM | POA: Diagnosis not present

## 2016-03-13 DIAGNOSIS — R1310 Dysphagia, unspecified: Secondary | ICD-10-CM | POA: Diagnosis not present

## 2016-03-13 DIAGNOSIS — H3530 Unspecified macular degeneration: Secondary | ICD-10-CM | POA: Diagnosis not present

## 2016-03-13 DIAGNOSIS — J449 Chronic obstructive pulmonary disease, unspecified: Secondary | ICD-10-CM | POA: Diagnosis not present

## 2016-03-13 DIAGNOSIS — J690 Pneumonitis due to inhalation of food and vomit: Secondary | ICD-10-CM | POA: Diagnosis not present

## 2016-03-13 DIAGNOSIS — J3801 Paralysis of vocal cords and larynx, unilateral: Secondary | ICD-10-CM | POA: Diagnosis not present

## 2016-03-14 DIAGNOSIS — H3530 Unspecified macular degeneration: Secondary | ICD-10-CM | POA: Diagnosis not present

## 2016-03-14 DIAGNOSIS — J449 Chronic obstructive pulmonary disease, unspecified: Secondary | ICD-10-CM | POA: Diagnosis not present

## 2016-03-14 DIAGNOSIS — J690 Pneumonitis due to inhalation of food and vomit: Secondary | ICD-10-CM | POA: Diagnosis not present

## 2016-03-14 DIAGNOSIS — R1310 Dysphagia, unspecified: Secondary | ICD-10-CM | POA: Diagnosis not present

## 2016-03-14 DIAGNOSIS — J3801 Paralysis of vocal cords and larynx, unilateral: Secondary | ICD-10-CM | POA: Diagnosis not present

## 2016-03-14 DIAGNOSIS — J9611 Chronic respiratory failure with hypoxia: Secondary | ICD-10-CM | POA: Diagnosis not present

## 2016-03-15 DIAGNOSIS — J449 Chronic obstructive pulmonary disease, unspecified: Secondary | ICD-10-CM | POA: Diagnosis not present

## 2016-03-15 DIAGNOSIS — H3530 Unspecified macular degeneration: Secondary | ICD-10-CM | POA: Diagnosis not present

## 2016-03-15 DIAGNOSIS — R1310 Dysphagia, unspecified: Secondary | ICD-10-CM | POA: Diagnosis not present

## 2016-03-15 DIAGNOSIS — J9611 Chronic respiratory failure with hypoxia: Secondary | ICD-10-CM | POA: Diagnosis not present

## 2016-03-15 DIAGNOSIS — J3801 Paralysis of vocal cords and larynx, unilateral: Secondary | ICD-10-CM | POA: Diagnosis not present

## 2016-03-15 DIAGNOSIS — J690 Pneumonitis due to inhalation of food and vomit: Secondary | ICD-10-CM | POA: Diagnosis not present

## 2016-03-16 DIAGNOSIS — R1310 Dysphagia, unspecified: Secondary | ICD-10-CM | POA: Diagnosis not present

## 2016-03-16 DIAGNOSIS — J690 Pneumonitis due to inhalation of food and vomit: Secondary | ICD-10-CM | POA: Diagnosis not present

## 2016-03-16 DIAGNOSIS — J449 Chronic obstructive pulmonary disease, unspecified: Secondary | ICD-10-CM | POA: Diagnosis not present

## 2016-03-16 DIAGNOSIS — J9611 Chronic respiratory failure with hypoxia: Secondary | ICD-10-CM | POA: Diagnosis not present

## 2016-03-16 DIAGNOSIS — J3801 Paralysis of vocal cords and larynx, unilateral: Secondary | ICD-10-CM | POA: Diagnosis not present

## 2016-03-16 DIAGNOSIS — H3530 Unspecified macular degeneration: Secondary | ICD-10-CM | POA: Diagnosis not present

## 2016-03-16 DIAGNOSIS — R1319 Other dysphagia: Secondary | ICD-10-CM | POA: Diagnosis not present

## 2016-03-17 DIAGNOSIS — J3801 Paralysis of vocal cords and larynx, unilateral: Secondary | ICD-10-CM | POA: Diagnosis not present

## 2016-03-17 DIAGNOSIS — R1310 Dysphagia, unspecified: Secondary | ICD-10-CM | POA: Diagnosis not present

## 2016-03-17 DIAGNOSIS — J449 Chronic obstructive pulmonary disease, unspecified: Secondary | ICD-10-CM | POA: Diagnosis not present

## 2016-03-17 DIAGNOSIS — J9611 Chronic respiratory failure with hypoxia: Secondary | ICD-10-CM | POA: Diagnosis not present

## 2016-03-17 DIAGNOSIS — J690 Pneumonitis due to inhalation of food and vomit: Secondary | ICD-10-CM | POA: Diagnosis not present

## 2016-03-17 DIAGNOSIS — H3530 Unspecified macular degeneration: Secondary | ICD-10-CM | POA: Diagnosis not present

## 2016-03-18 DIAGNOSIS — J690 Pneumonitis due to inhalation of food and vomit: Secondary | ICD-10-CM | POA: Diagnosis not present

## 2016-03-18 DIAGNOSIS — R1310 Dysphagia, unspecified: Secondary | ICD-10-CM | POA: Diagnosis not present

## 2016-03-18 DIAGNOSIS — J449 Chronic obstructive pulmonary disease, unspecified: Secondary | ICD-10-CM | POA: Diagnosis not present

## 2016-03-18 DIAGNOSIS — J3801 Paralysis of vocal cords and larynx, unilateral: Secondary | ICD-10-CM | POA: Diagnosis not present

## 2016-03-18 DIAGNOSIS — H3530 Unspecified macular degeneration: Secondary | ICD-10-CM | POA: Diagnosis not present

## 2016-03-18 DIAGNOSIS — J9611 Chronic respiratory failure with hypoxia: Secondary | ICD-10-CM | POA: Diagnosis not present

## 2016-03-19 DIAGNOSIS — H3530 Unspecified macular degeneration: Secondary | ICD-10-CM | POA: Diagnosis not present

## 2016-03-19 DIAGNOSIS — R1310 Dysphagia, unspecified: Secondary | ICD-10-CM | POA: Diagnosis not present

## 2016-03-19 DIAGNOSIS — J3801 Paralysis of vocal cords and larynx, unilateral: Secondary | ICD-10-CM | POA: Diagnosis not present

## 2016-03-19 DIAGNOSIS — J690 Pneumonitis due to inhalation of food and vomit: Secondary | ICD-10-CM | POA: Diagnosis not present

## 2016-03-19 DIAGNOSIS — J9611 Chronic respiratory failure with hypoxia: Secondary | ICD-10-CM | POA: Diagnosis not present

## 2016-03-19 DIAGNOSIS — J449 Chronic obstructive pulmonary disease, unspecified: Secondary | ICD-10-CM | POA: Diagnosis not present

## 2016-03-20 DIAGNOSIS — J449 Chronic obstructive pulmonary disease, unspecified: Secondary | ICD-10-CM | POA: Diagnosis not present

## 2016-03-20 DIAGNOSIS — J9611 Chronic respiratory failure with hypoxia: Secondary | ICD-10-CM | POA: Diagnosis not present

## 2016-03-20 DIAGNOSIS — H3530 Unspecified macular degeneration: Secondary | ICD-10-CM | POA: Diagnosis not present

## 2016-03-20 DIAGNOSIS — J690 Pneumonitis due to inhalation of food and vomit: Secondary | ICD-10-CM | POA: Diagnosis not present

## 2016-03-20 DIAGNOSIS — J3801 Paralysis of vocal cords and larynx, unilateral: Secondary | ICD-10-CM | POA: Diagnosis not present

## 2016-03-20 DIAGNOSIS — R1310 Dysphagia, unspecified: Secondary | ICD-10-CM | POA: Diagnosis not present

## 2016-03-23 DIAGNOSIS — H3530 Unspecified macular degeneration: Secondary | ICD-10-CM | POA: Diagnosis not present

## 2016-03-23 DIAGNOSIS — R1310 Dysphagia, unspecified: Secondary | ICD-10-CM | POA: Diagnosis not present

## 2016-03-23 DIAGNOSIS — J449 Chronic obstructive pulmonary disease, unspecified: Secondary | ICD-10-CM | POA: Diagnosis not present

## 2016-03-23 DIAGNOSIS — J3801 Paralysis of vocal cords and larynx, unilateral: Secondary | ICD-10-CM | POA: Diagnosis not present

## 2016-03-23 DIAGNOSIS — J690 Pneumonitis due to inhalation of food and vomit: Secondary | ICD-10-CM | POA: Diagnosis not present

## 2016-03-23 DIAGNOSIS — J9611 Chronic respiratory failure with hypoxia: Secondary | ICD-10-CM | POA: Diagnosis not present

## 2016-03-24 DIAGNOSIS — H3530 Unspecified macular degeneration: Secondary | ICD-10-CM | POA: Diagnosis not present

## 2016-03-24 DIAGNOSIS — J449 Chronic obstructive pulmonary disease, unspecified: Secondary | ICD-10-CM | POA: Diagnosis not present

## 2016-03-24 DIAGNOSIS — J3801 Paralysis of vocal cords and larynx, unilateral: Secondary | ICD-10-CM | POA: Diagnosis not present

## 2016-03-24 DIAGNOSIS — J9611 Chronic respiratory failure with hypoxia: Secondary | ICD-10-CM | POA: Diagnosis not present

## 2016-03-24 DIAGNOSIS — R1310 Dysphagia, unspecified: Secondary | ICD-10-CM | POA: Diagnosis not present

## 2016-03-24 DIAGNOSIS — J690 Pneumonitis due to inhalation of food and vomit: Secondary | ICD-10-CM | POA: Diagnosis not present

## 2016-03-25 DIAGNOSIS — H3530 Unspecified macular degeneration: Secondary | ICD-10-CM | POA: Diagnosis not present

## 2016-03-25 DIAGNOSIS — J449 Chronic obstructive pulmonary disease, unspecified: Secondary | ICD-10-CM | POA: Diagnosis not present

## 2016-03-25 DIAGNOSIS — R1310 Dysphagia, unspecified: Secondary | ICD-10-CM | POA: Diagnosis not present

## 2016-03-25 DIAGNOSIS — J3801 Paralysis of vocal cords and larynx, unilateral: Secondary | ICD-10-CM | POA: Diagnosis not present

## 2016-03-25 DIAGNOSIS — J9611 Chronic respiratory failure with hypoxia: Secondary | ICD-10-CM | POA: Diagnosis not present

## 2016-03-25 DIAGNOSIS — J690 Pneumonitis due to inhalation of food and vomit: Secondary | ICD-10-CM | POA: Diagnosis not present

## 2016-04-02 DEATH — deceased

## 2017-09-24 IMAGING — MR MR HEAD W/O CM
9 of 10 series · 34 of 48 positions shown · non-contrast
Comparison: Prior CT from 02/28/2016.

CLINICAL DATA: Initial evaluation for worsening dysphagia. Evaluate
for stroke.

EXAM:
MRI HEAD WITHOUT CONTRAST
TECHNIQUE: Multiplanar, multiecho pulse sequences of the brain and surrounding
structures were obtained without intravenous contrast.

[Series 4: DWI · axial · 3.0mm · 0.94mm/px · z∈[-62,+85]mm · 8 of 100 slices shown (1 of 2)]
[im 1/100]
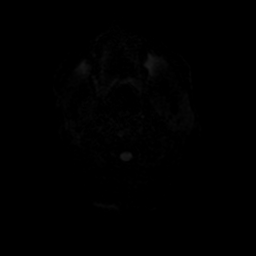
[im 12/100]
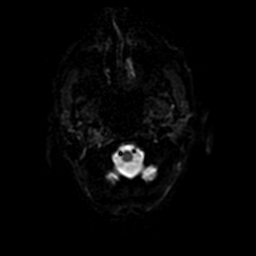
[im 34/100]
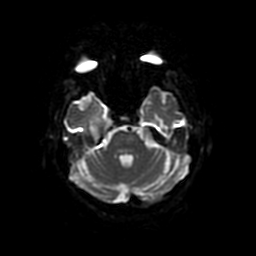
[im 45/100]
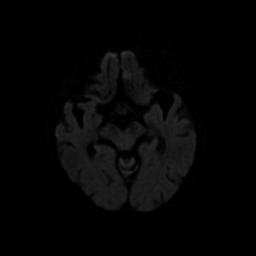
[im 56/100]
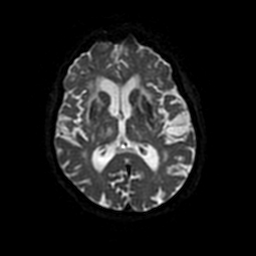
[im 67/100]
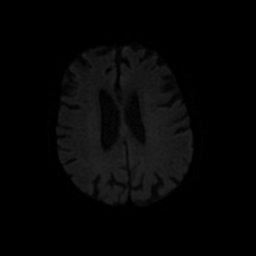
[im 89/100]
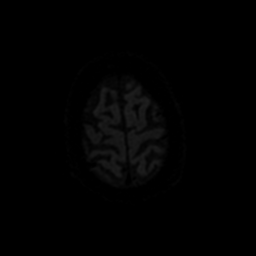
[im 100/100]
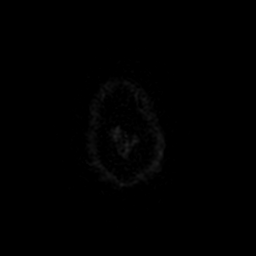

[Series 5: FLAIR · sagittal · 5.0mm · 0.47mm/px · 2 of 23 slices shown (1 of 2)]
[im 1/23]
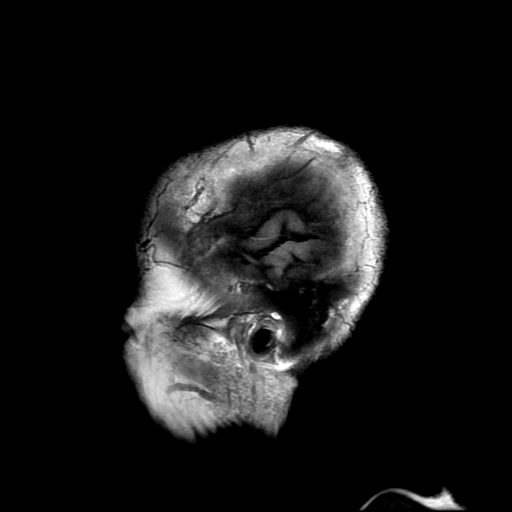
[im 23/23]
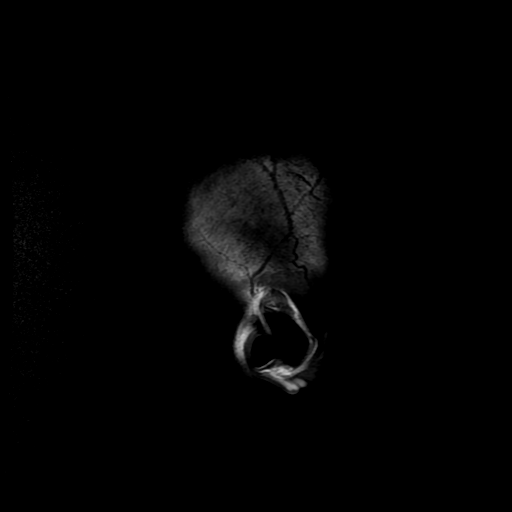

[Series 6: FLAIR · axial · 5.0mm · 0.41mm/px · z∈[-60,+84]mm · 2 of 25 slices shown (2 of 2)]
[im 1/25]
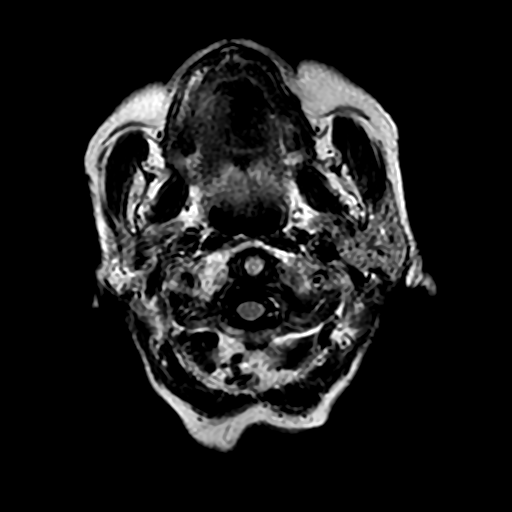
[im 25/25]
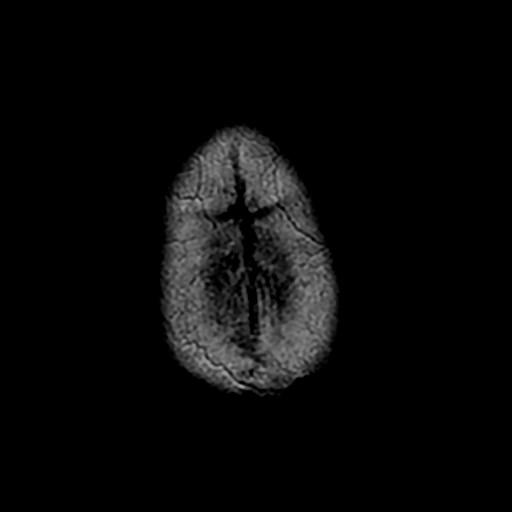

[Series 7: T2 · axial · 5.0mm · 0.47mm/px · z∈[-61,+83]mm · 2 of 25 slices shown (1 of 2)]
[im 1/25]
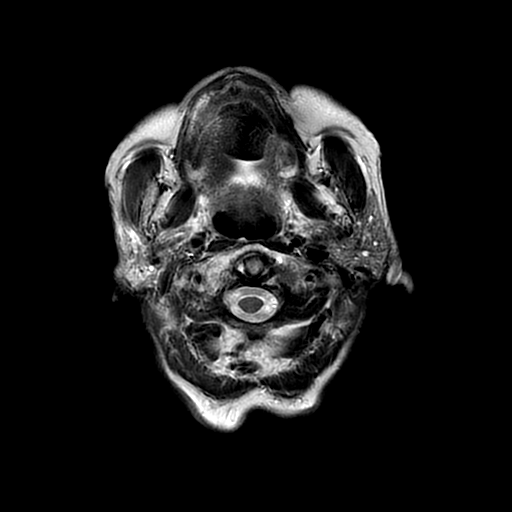
[im 25/25]
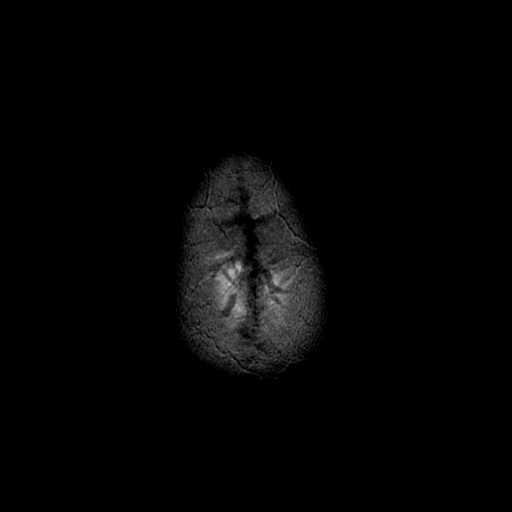

[Series 8: DWI · coronal · 4.0mm · 0.94mm/px · 6 of 68 slices shown (2 of 2)]
[im 1/68]
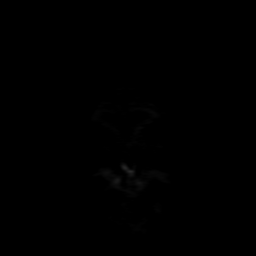
[im 14/68]
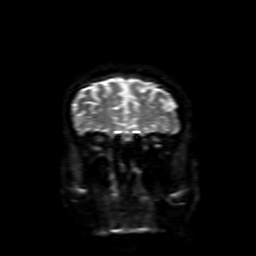
[im 27/68]
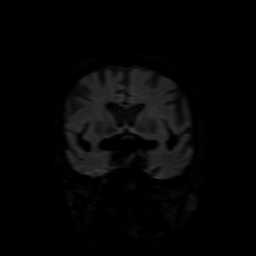
[im 41/68]
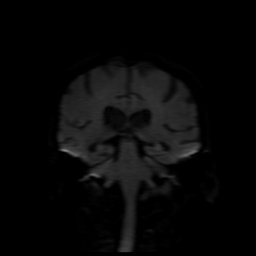
[im 54/68]
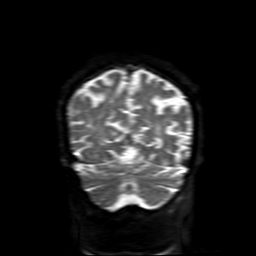
[im 68/68]
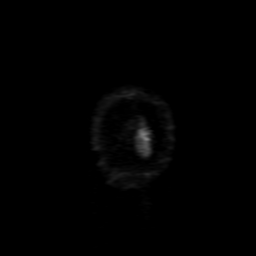

[Series 9: (person_name) · axial · 3.0mm · 0.47mm/px · z∈[-62,-13]mm · 3 of 100 slices shown]
[im 1/100]
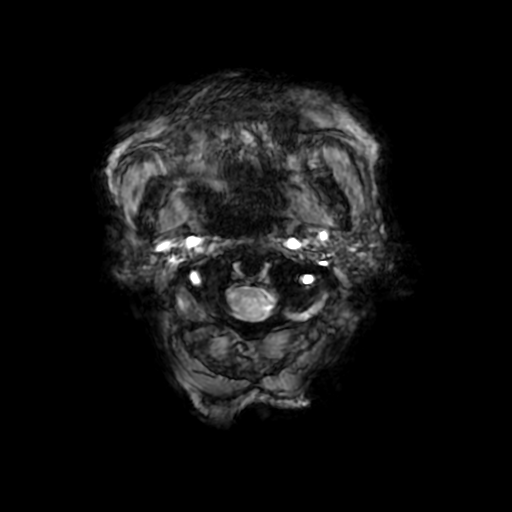
[im 12/100]
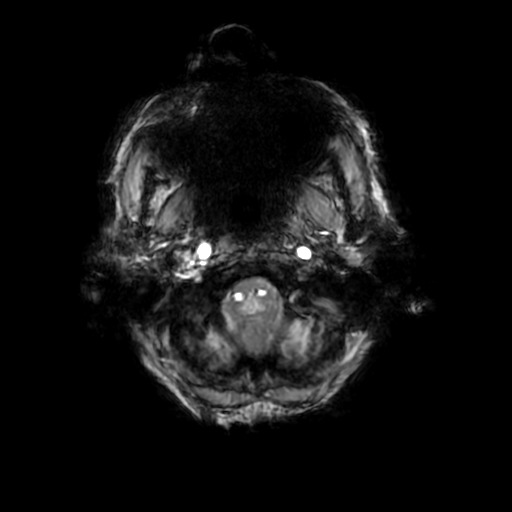
[im 34/100]
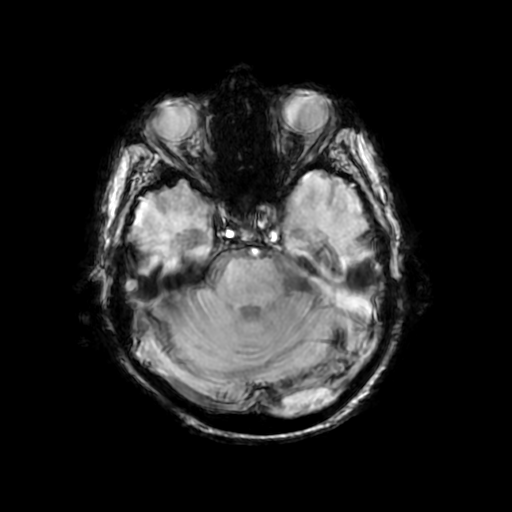

[Series 11: T2 · coronal · 5.0mm · 0.39mm/px · 3 of 28 slices shown (2 of 2)]
[im 1/28]
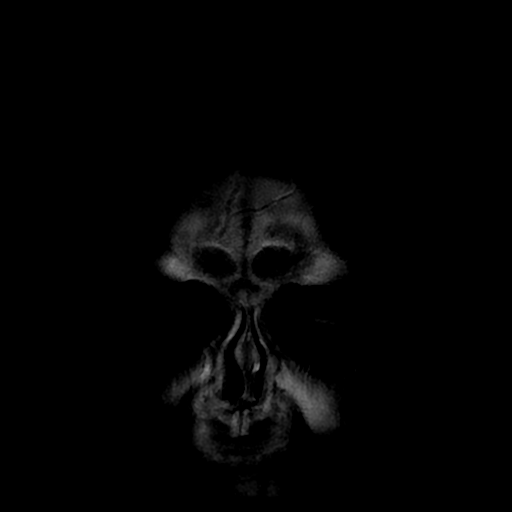
[im 14/28]
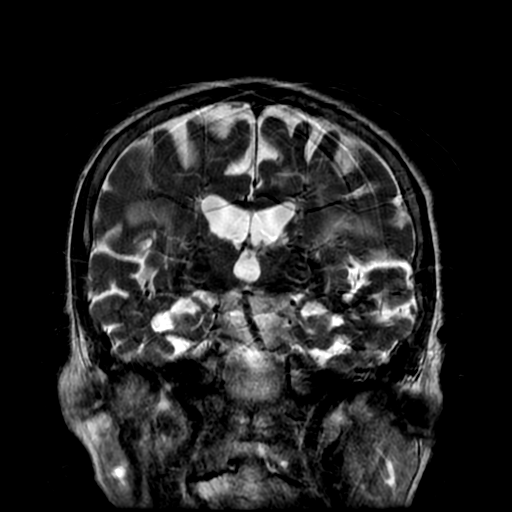
[im 28/28]
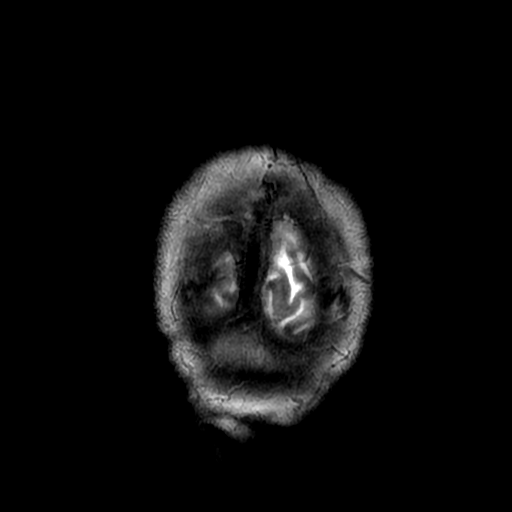

[Series 450: ADC · axial · 3.0mm · 0.94mm/px · z∈[-62,+85]mm · 5 of 50 slices shown (1 of 2)]
[im 1/50]
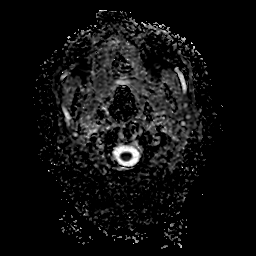
[im 13/50]
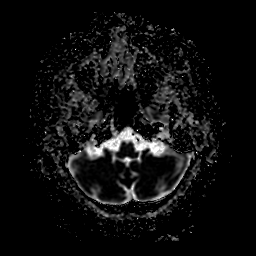
[im 25/50]
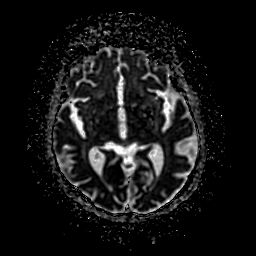
[im 37/50]
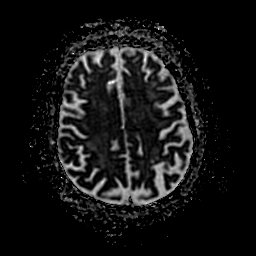
[im 50/50]
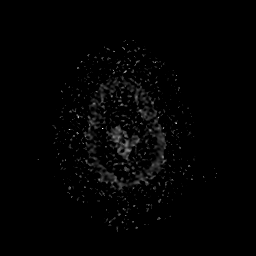

[Series 850: ADC · coronal · 4.0mm · 0.94mm/px · 3 of 34 slices shown (2 of 2)]
[im 1/34]
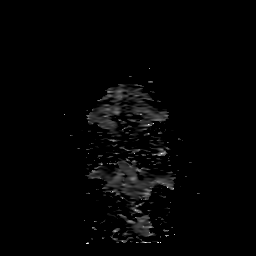
[im 17/34]
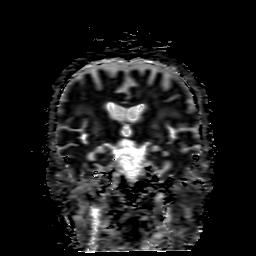
[im 34/34]
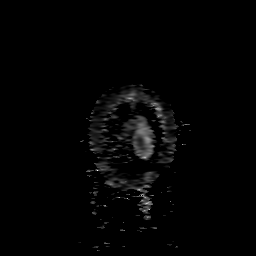

[34 of 48 positions shown; findings below may reference images not displayed]

FINDINGS: Brain: Diffuse prominence of the CSF containing spaces compatible
with generalized cerebral atrophy. Atrophy most probable it within
the anterior temporal lobes bilaterally. Patchy and confluent
T2/FLAIR hyperintensity within the periventricular and deep white
matter both cerebral hemispheres most compatible with chronic small
vessel ischemic disease, advanced in nature. Chronic microvascular
ischemic changes present within the pons as well.

No abnormal foci of restricted diffusion to suggest acute or
subacute ischemia. Gray-white matter differentiation maintained. No
evidence for acute intracranial hemorrhage. No made of a
subcentimeter focus of susceptibility artifact within the left
occipital lobe, likely a small chronic microhemorrhage.

No mass lesion, midline shift or mass effect. Ventricular prominence
related to global parenchymal volume loss without hydrocephalus. No
extra-axial fluid collection. Major dural sinuses are grossly
patent.

Pituitary gland normal.

Vascular: Major intracranial vascular flow voids are maintained.

Skull and upper cervical spine: Craniocervical junction within
normal limits. Grade 1 anterolisthesis of C3 on C4 noted. No
significant stenosis within the visualized upper cervical spine.
Bone marrow signal intensity within normal limits. No scalp soft
tissue abnormality.

Sinuses/Orbits: Globes and orbital soft tissues within normal
limits. Patient is status post cataract extraction bilaterally.
Paranasal sinuses are clear. Trace opacity noted within the right
mastoid air cells. Left mastoid air cells clear.
IMPRESSION: 1. No acute intracranial infarct or other process identified.
2. A advanced cerebral atrophy with chronic microvascular ischemic
disease.
